# Patient Record
Sex: Female | Born: 1990 | Race: Black or African American | Hispanic: No | Marital: Married | Smoking: Never smoker
Health system: Southern US, Community
[De-identification: ages and names within clinical notes are randomized; demographics above are authoritative.]

## PROBLEM LIST (undated history)

## (undated) ENCOUNTER — Inpatient Hospital Stay (HOSPITAL_COMMUNITY): Payer: Self-pay

## (undated) DIAGNOSIS — Z789 Other specified health status: Secondary | ICD-10-CM

---
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
## 2008-02-14 ENCOUNTER — Ambulatory Visit: Payer: Self-pay | Admitting: Family Medicine

## 2008-03-26 ENCOUNTER — Emergency Department (HOSPITAL_COMMUNITY): Admission: EM | Admit: 2008-03-26 | Discharge: 2008-03-26 | Payer: Self-pay | Admitting: Emergency Medicine

## 2008-05-06 ENCOUNTER — Ambulatory Visit: Payer: Self-pay | Admitting: Family Medicine

## 2008-05-13 ENCOUNTER — Other Ambulatory Visit: Admission: RE | Admit: 2008-05-13 | Discharge: 2008-05-13 | Payer: Self-pay | Admitting: Family Medicine

## 2008-05-13 ENCOUNTER — Ambulatory Visit: Payer: Self-pay | Admitting: Family Medicine

## 2008-05-13 ENCOUNTER — Encounter (INDEPENDENT_AMBULATORY_CARE_PROVIDER_SITE_OTHER): Payer: Self-pay | Admitting: Family Medicine

## 2008-05-13 DIAGNOSIS — E669 Obesity, unspecified: Secondary | ICD-10-CM

## 2008-05-14 ENCOUNTER — Ambulatory Visit: Payer: Self-pay | Admitting: Family Medicine

## 2008-05-14 ENCOUNTER — Encounter (INDEPENDENT_AMBULATORY_CARE_PROVIDER_SITE_OTHER): Payer: Self-pay | Admitting: Family Medicine

## 2008-05-14 ENCOUNTER — Telehealth (INDEPENDENT_AMBULATORY_CARE_PROVIDER_SITE_OTHER): Payer: Self-pay | Admitting: *Deleted

## 2008-05-15 ENCOUNTER — Emergency Department (HOSPITAL_COMMUNITY): Admission: EM | Admit: 2008-05-15 | Discharge: 2008-05-15 | Payer: Self-pay | Admitting: Emergency Medicine

## 2008-05-19 ENCOUNTER — Encounter (INDEPENDENT_AMBULATORY_CARE_PROVIDER_SITE_OTHER): Payer: Self-pay | Admitting: Family Medicine

## 2008-05-27 IMAGING — CR DG ABD PORTABLE 1V
1 series · 1 of 1 positions shown · non-contrast
Comparison: None.

CLINICAL DATA: Post-op C-section.  Incorrect sponge count.  
 PORTABLE ABDOMEN - 1 VIEW 03/28/2007 AT 0000 HOURS:

[view not recorded]
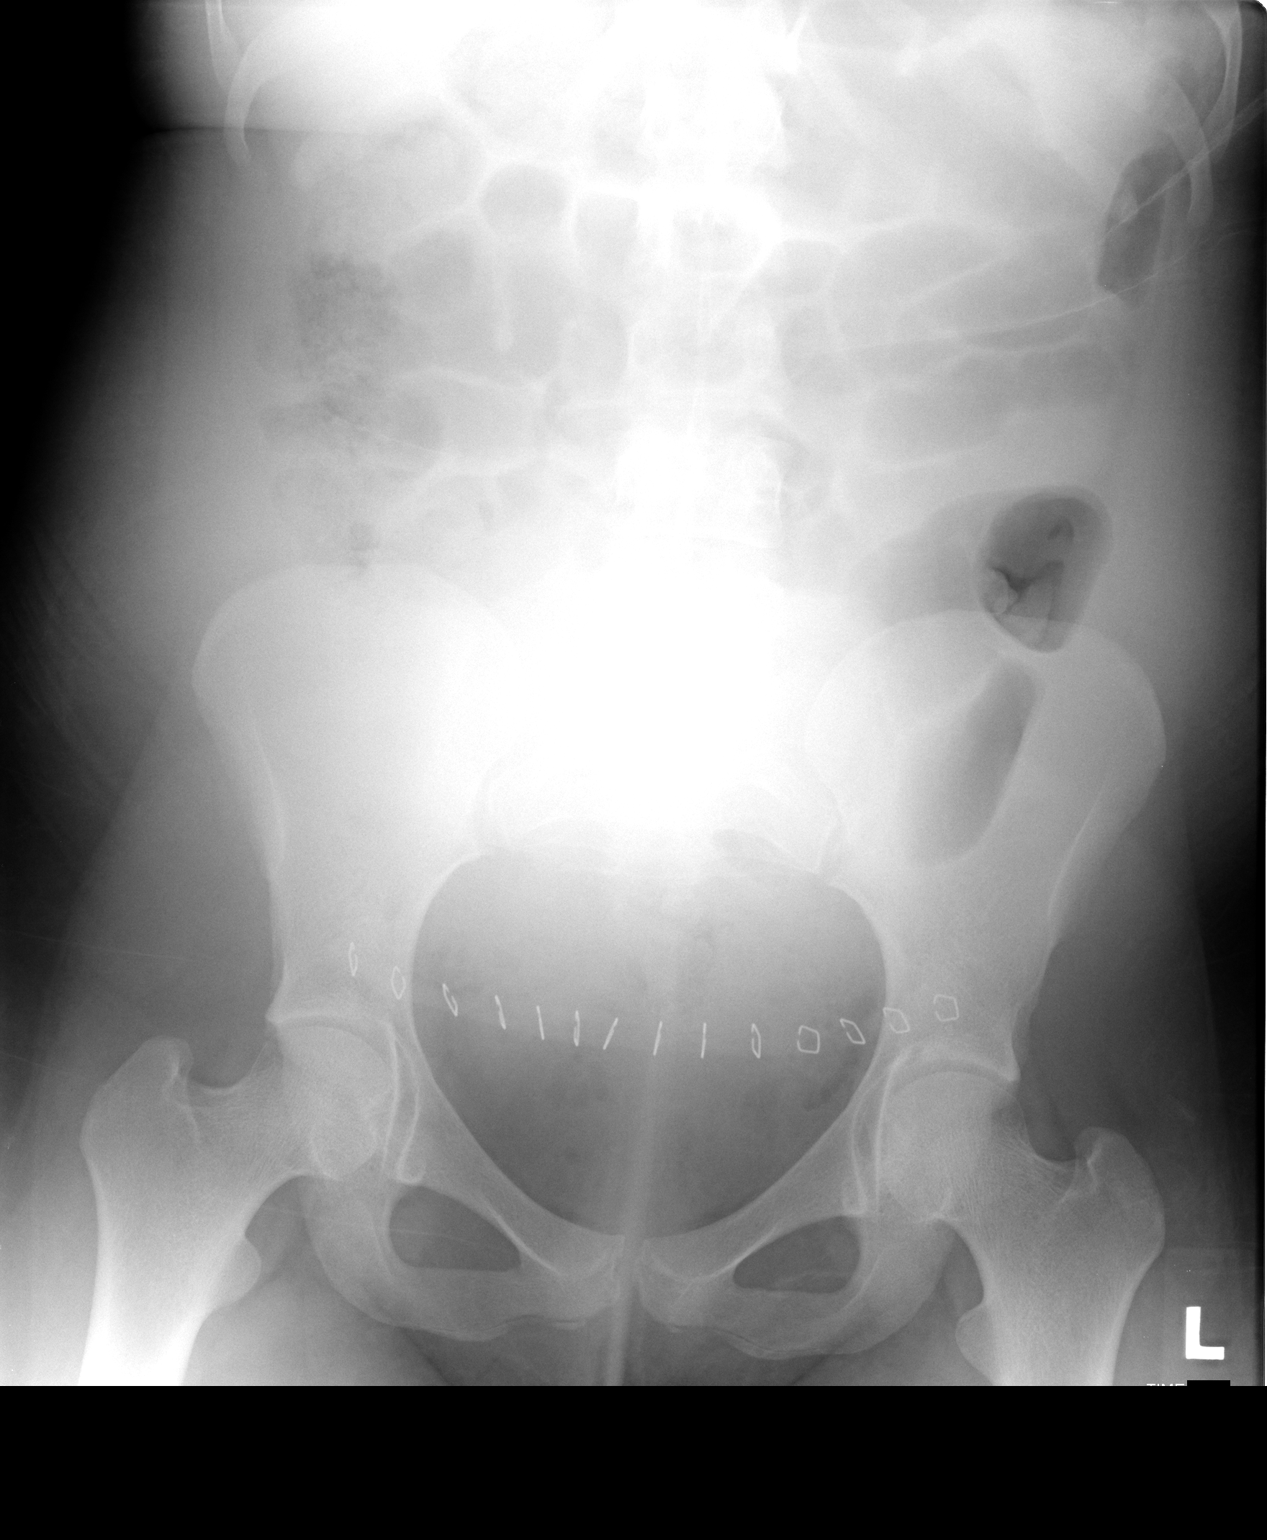

[1 of 1 positions shown; findings below may reference images not displayed]

FINDINGS: The study excludes the upper abdomen.  Midline skin staples are noted over the pelvis.  No retained sponge or instrument is demonstrated.  The bowel gas pattern appears nonobstructive.
IMPRESSION: No evidence of retained sponge in the field of view.

## 2008-07-08 ENCOUNTER — Emergency Department (HOSPITAL_COMMUNITY): Admission: EM | Admit: 2008-07-08 | Discharge: 2008-07-08 | Payer: Self-pay | Admitting: Emergency Medicine

## 2008-07-23 ENCOUNTER — Ambulatory Visit: Payer: Self-pay | Admitting: Family Medicine

## 2008-08-20 ENCOUNTER — Emergency Department (HOSPITAL_COMMUNITY): Admission: EM | Admit: 2008-08-20 | Discharge: 2008-08-20 | Payer: Self-pay | Admitting: Emergency Medicine

## 2008-09-17 ENCOUNTER — Ambulatory Visit: Payer: Self-pay | Admitting: Family Medicine

## 2008-09-18 ENCOUNTER — Ambulatory Visit: Payer: Self-pay | Admitting: Family Medicine

## 2008-09-19 ENCOUNTER — Encounter: Payer: Self-pay | Admitting: Family Medicine

## 2008-09-21 ENCOUNTER — Ambulatory Visit: Payer: Self-pay | Admitting: Family Medicine

## 2008-09-23 ENCOUNTER — Ambulatory Visit: Payer: Self-pay | Admitting: Family Medicine

## 2008-09-25 ENCOUNTER — Ambulatory Visit: Payer: Self-pay | Admitting: Family Medicine

## 2008-09-25 ENCOUNTER — Encounter: Payer: Self-pay | Admitting: *Deleted

## 2008-09-25 ENCOUNTER — Telehealth (INDEPENDENT_AMBULATORY_CARE_PROVIDER_SITE_OTHER): Payer: Self-pay | Admitting: Family Medicine

## 2008-10-09 ENCOUNTER — Ambulatory Visit: Payer: Self-pay | Admitting: Family Medicine

## 2008-10-28 ENCOUNTER — Emergency Department (HOSPITAL_COMMUNITY): Admission: EM | Admit: 2008-10-28 | Discharge: 2008-10-28 | Payer: Self-pay | Admitting: Emergency Medicine

## 2008-11-02 ENCOUNTER — Encounter (INDEPENDENT_AMBULATORY_CARE_PROVIDER_SITE_OTHER): Payer: Self-pay | Admitting: Family Medicine

## 2009-01-04 ENCOUNTER — Ambulatory Visit: Payer: Self-pay | Admitting: Family Medicine

## 2009-01-17 ENCOUNTER — Emergency Department (HOSPITAL_COMMUNITY): Admission: EM | Admit: 2009-01-17 | Discharge: 2009-01-18 | Payer: Self-pay | Admitting: Emergency Medicine

## 2009-04-02 ENCOUNTER — Ambulatory Visit: Payer: Self-pay | Admitting: Family Medicine

## 2009-05-25 ENCOUNTER — Ambulatory Visit: Payer: Self-pay | Admitting: Family Medicine

## 2009-05-25 ENCOUNTER — Encounter: Payer: Self-pay | Admitting: Family Medicine

## 2009-05-25 ENCOUNTER — Other Ambulatory Visit: Admission: RE | Admit: 2009-05-25 | Discharge: 2009-05-25 | Payer: Self-pay | Admitting: Family Medicine

## 2009-06-01 ENCOUNTER — Encounter: Payer: Self-pay | Admitting: Family Medicine

## 2009-06-01 ENCOUNTER — Ambulatory Visit: Payer: Self-pay | Admitting: Family Medicine

## 2009-06-01 LAB — CONVERTED CEMR LAB
CO2: 21 meq/L (ref 19–32)
Calcium: 9.4 mg/dL (ref 8.4–10.5)
Creatinine, Ser: 0.6 mg/dL (ref 0.40–1.20)

## 2009-06-30 ENCOUNTER — Ambulatory Visit: Payer: Self-pay | Admitting: Family Medicine

## 2009-07-23 ENCOUNTER — Ambulatory Visit: Payer: Self-pay | Admitting: Family Medicine

## 2009-07-26 ENCOUNTER — Encounter: Payer: Self-pay | Admitting: *Deleted

## 2009-08-03 ENCOUNTER — Encounter: Admission: RE | Admit: 2009-08-03 | Discharge: 2009-08-03 | Payer: Self-pay | Admitting: Family Medicine

## 2009-08-27 ENCOUNTER — Ambulatory Visit: Payer: Self-pay | Admitting: Family Medicine

## 2009-08-30 ENCOUNTER — Ambulatory Visit: Payer: Self-pay | Admitting: Family Medicine

## 2009-09-16 ENCOUNTER — Ambulatory Visit: Payer: Self-pay | Admitting: Family Medicine

## 2009-11-01 ENCOUNTER — Emergency Department (HOSPITAL_COMMUNITY): Admission: EM | Admit: 2009-11-01 | Discharge: 2009-11-01 | Payer: Self-pay | Admitting: Emergency Medicine

## 2009-12-15 ENCOUNTER — Ambulatory Visit: Payer: Self-pay | Admitting: Family Medicine

## 2009-12-17 ENCOUNTER — Emergency Department (HOSPITAL_COMMUNITY): Admission: EM | Admit: 2009-12-17 | Discharge: 2009-12-17 | Payer: Self-pay | Admitting: Emergency Medicine

## 2010-03-14 ENCOUNTER — Ambulatory Visit: Payer: Self-pay | Admitting: Family Medicine

## 2010-05-31 ENCOUNTER — Ambulatory Visit: Payer: Self-pay | Admitting: Family Medicine

## 2010-06-15 ENCOUNTER — Ambulatory Visit: Admission: RE | Admit: 2010-06-15 | Discharge: 2010-06-15 | Payer: Self-pay | Source: Home / Self Care

## 2010-06-15 ENCOUNTER — Encounter: Payer: Self-pay | Admitting: Family Medicine

## 2010-06-15 ENCOUNTER — Other Ambulatory Visit
Admission: RE | Admit: 2010-06-15 | Discharge: 2010-06-15 | Payer: Self-pay | Source: Home / Self Care | Admitting: Family Medicine

## 2010-06-21 ENCOUNTER — Encounter: Payer: Self-pay | Admitting: Family Medicine

## 2010-06-27 ENCOUNTER — Ambulatory Visit: Admission: RE | Admit: 2010-06-27 | Discharge: 2010-06-27 | Payer: Self-pay | Source: Home / Self Care

## 2010-07-03 ENCOUNTER — Encounter: Payer: Self-pay | Admitting: Family Medicine

## 2010-07-10 LAB — CONVERTED CEMR LAB
BUN: 14 mg/dL (ref 6–23)
Chlamydia, DNA Probe: POSITIVE — AB
Chloride: 104 meq/L (ref 96–112)
Creatinine, Ser: 0.56 mg/dL (ref 0.40–1.20)
GTT, 1 hr: <140 mg/dL

## 2010-07-12 NOTE — Assessment & Plan Note (Signed)
Summary: lump in breast,tcb   Vital Signs:  Patient profile:   20 year old female Height:      61 inches Weight:      196 pounds BMI:     37.17 Temp:     98.4 degrees F oral BP sitting:   116 / 70  (left arm) Cuff size:   large  Vitals Entered By: Tessie Fass CMA, (July 23, 2009 3:19 PM) CC: lump on right breast Is Patient Diabetic? No Pain Assessment Patient in pain? no        Primary Care Provider:  Jamie Brookes MD  CC:  lump on right breast.  History of Present Illness: CC: lump right breast  Yesterday felt lump right breast medial to nipple.  Tender when pusing on it, hadn't noticed before.  No recent f/c/n/v/breast pain o/w, discharge, weight changes, or recent viral ilnesses.  No redness, warmth at area.  On depo shot - started 5 years ago.  Recently restarted 2 yrs ago.  fm hx of breast Ca in aunt, who passed from it, unsure age.  concerned about what happened to aunt.  Habits & Providers  Alcohol-Tobacco-Diet     Tobacco Status: never  Current Medications (verified): 1)  Depo-Provera 150 Mg/ml Susp (Medroxyprogesterone Acetate) .Marland Kitchen.. 1 Inj Q3 Months 2)  Calcium Citrate 200 Mg Tabs (Calcium Citrate) .... Take 3 Pills Daily For Your Bone Health  Allergies: 1)  ! Bactrim  Past History:  Past medical, surgical, family and social histories (including risk factors) reviewed for relevance to current acute and chronic problems.  Past Medical History: Reviewed history from 05/13/2008 and no changes required. OBESITY (ICD-278.00)  Past Surgical History: Reviewed history from 05/13/2008 and no changes required. TAB - 05/12/2004 c/s 10/08  Family History: HTN-dad  DM-sister, dad, GM Breast cancer - aunt who passed from it, unsure age  Cancer-uncle colon cancer  Social History: Reviewed history from 05/25/2009 and no changes required. lives with son (2) Early childhood education at [facility] no exercise; no sports  sexually active - uses condoms  occasionally no tobacco/ETOH/drugs  Physical Exam  General:  Well-developed,well-nourished,in no acute distress; alert,appropriate and cooperative throughout examination Breasts:  Left breast: No mass, nodules, thickening, tenderness, bulging, retraction, inflamation, nipple discharge or skin changes noted.   Right breast: fixed lump medial and slightly superior to aureola.  No axillary LAD.  No erythema, edema, not tender to palpation.   Impression & Recommendations:  Problem # 1:  BREAST MASS, RIGHT (ICD-611.72) likely benign, however given somewhat fixed and fm hx will refer to breast for diagnostic mammogram.  Advised we will call her with appt, to call us if not heard within 1 wk.    Orders: [facility]- Est Level  3 (99213) Mammogram (Diagnostic) (Mammo)  Complete Medication List: 1)  Depo-provera 150 Mg/ml Susp (Medroxyprogesterone acetate) .Marland Kitchen.. 1 inj q3 months 2)  Calcium Citrate 200 Mg Tabs (Calcium citrate) .... Take 3 pills daily for your bone health  Patient Instructions: 1)  This is likely something benign called fibrocystic breast change. 2)  However, we will still set you up for a mammogram and ultrasound to ensure that everything looks ok.  3)  Please call us if you haven't had your ultrasound in 2 wks.

## 2010-07-12 NOTE — Assessment & Plan Note (Signed)
Summary: read ppd/kh  Nurse Visit   Allergies: 1)  ! Bactrim  PPD Results    Date of reading: 08/30/2009    Results: 0 mm    Interpretation: negative  Orders Added: 1)  No Charge Patient Arrived (NCPA0) [NCPA0]

## 2010-07-12 NOTE — Assessment & Plan Note (Signed)
Summary: DEPO/[facility]  Nurse Visit   Allergies: 1)  ! Bactrim  Medication Administration  Injection # 1:    Medication: Depo-Provera 150mg     Diagnosis: CONTRACEPTIVE MANAGEMENT (ICD-V25.09)    Route: IM    Site: LUOQ gluteus    Exp Date: 10/11/2010    Lot #: [id number]    Mfr: Francisca December    Comments: Next Depo Due: June 23 - July 7    Patient tolerated injection without complications    Given by: Garen Grams LPN (September 16, 452 2:04 PM)  Orders Added: 1)  Depo-Provera 150mg  [J1055] 2)  Est Level 1- [facility] [09811]   Medication Administration  Injection # 1:    Medication: Depo-Provera 150mg     Diagnosis: CONTRACEPTIVE MANAGEMENT (ICD-V25.09)    Route: IM    Site: LUOQ gluteus    Exp Date: 10/11/2010    Lot #: [id number]    Mfr: Francisca December    Comments: Next Depo Due: June 23 - July 7    Patient tolerated injection without complications    Given by: Garen Grams LPN (September 16, 9560 2:04 PM)  Orders Added: 1)  Depo-Provera 150mg  [J1055] 2)  Est Level 1- [facility] [13086]

## 2010-07-12 NOTE — Assessment & Plan Note (Signed)
Summary: depo,df  Nurse Visit   Allergies: 1)  ! Bactrim  Medication Administration  Injection # 1:    Medication: Depo-Provera 150mg     Diagnosis: CONTRACEPTIVE MANAGEMENT (ICD-V25.09)    Route: IM    Site: R deltoid    Exp Date: 09/2011    Lot #: [id number]    Mfr: [email or web address]    Comments: next Depo due April 6 thru September 29, 2009    Patient tolerated injection without complications    Given by: Theresia Lo RN (June 30, 2009 2:44 PM)  Orders Added: 1)  Depo-Provera 150mg  [J1055] 2)  Admin of Injection (IM/SQ) [04540]   Medication Administration  Injection # 1:    Medication: Depo-Provera 150mg     Diagnosis: CONTRACEPTIVE MANAGEMENT (ICD-V25.09)    Route: IM    Site: R deltoid    Exp Date: 09/2011    Lot #: [id number]    Mfr: [email or web address]    Comments: next Depo due April 6 thru September 29, 2009    Patient tolerated injection without complications    Given by: Theresia Lo RN (June 30, 2009 2:44 PM)  Orders Added: 1)  Depo-Provera 150mg  [J1055] 2)  Admin of Injection (IM/SQ) [47829]

## 2010-07-12 NOTE — Assessment & Plan Note (Signed)
Summary: depo,tcb  Nurse Visit   Allergies: 1)  ! Bactrim  Medication Administration  Injection # 1:    Medication: Depo-Provera 150mg     Diagnosis: CONTRACEPTIVE MANAGEMENT (ICD-V25.09)    Route: IM    Site: L deltoid    Exp Date: 07/2012    Lot #: [id number]    Mfr: [email or web address]    Comments: next depo due Sept 21 thru Mar 15, 2010    Patient tolerated injection without complications    Given by: Theresia Lo RN (December 15, 2009 2:58 PM)  Orders Added: 1)  Depo-Provera 150mg  [J1055] 2)  Admin of Injection (IM/SQ) [[id number]]   Medication Administration  Injection # 1:    Medication: Depo-Provera 150mg     Diagnosis: CONTRACEPTIVE MANAGEMENT (ICD-V25.09)    Route: IM    Site: L deltoid    Exp Date: 07/2012    Lot #: [id number]    Mfr: [email or web address]    Comments: next depo due Sept 21 thru Mar 15, 2010    Patient tolerated injection without complications    Given by: Theresia Lo RN (December 15, 2009 2:58 PM)  Orders Added: 1)  Depo-Provera 150mg  [J1055] 2)  Admin of Injection (IM/SQ) [75643]

## 2010-07-12 NOTE — Assessment & Plan Note (Signed)
Summary: depo,tcb  Nurse Visit   Allergies: 1)  ! Bactrim  Medication Administration  Injection # 1:    Medication: Depo-Provera 150mg     Diagnosis: CONTRACEPTIVE MANAGEMENT (ICD-V25.09)    Route: IM    Site: L deltoid    Exp Date: 08/2012    Lot #: [id number]    Mfr: [email or web address]    Comments: Next depo due Dec 19 thru   Jun 13, 2010    Patient tolerated injection without complications    Given by: Theresia Lo RN (March 14, 2010 3:28 PM)  Orders Added: 1)  Depo-Provera 150mg  [J1055] 2)  Admin of Injection (IM/SQ) [04540]   Medication Administration  Injection # 1:    Medication: Depo-Provera 150mg     Diagnosis: CONTRACEPTIVE MANAGEMENT (ICD-V25.09)    Route: IM    Site: L deltoid    Exp Date: 08/2012    Lot #: [id number]    Mfr: [email or web address]    Comments: Next depo due Dec 19 thru   Jun 13, 2010    Patient tolerated injection without complications    Given by: Theresia Lo RN (March 14, 2010 3:28 PM)  Orders Added: 1)  Depo-Provera 150mg  [J1055] 2)  Admin of Injection (IM/SQ) [47829]

## 2010-07-12 NOTE — Miscellaneous (Signed)
Summary: re: breast US/TS  Clinical Lists Changes  Orders: Added new Test order of Ultrasound (Ultrasound) - Signed

## 2010-07-12 NOTE — Assessment & Plan Note (Signed)
Summary: tb test,tcb  Nurse Visit   Allergies: 1)  ! Bactrim  Immunizations Administered:  PPD Skin Test:    Vaccine Type: PPD    Site: right forearm    Mfr: Sanofi Pasteur    Dose: 0.1 ml    Route: ID    Given by: Theresia Lo RN    Exp. Date: 11/07/2011    Lot #: [id number]  Orders Added: 1)  TB Skin Test [86580] 2)  Admin 1st Vaccine [id number]

## 2010-07-14 NOTE — Assessment & Plan Note (Signed)
Summary: physical/pap/bmc   Vital Signs:  Patient profile:   20 year old female Height:      61 inches Weight:      202 pounds BMI:     38.31 Temp:     98.5 degrees F oral Pulse rate:   77 / minute Pulse rhythm:   regular BP sitting:   110 / 70  (left arm) Cuff size:   large  Vitals Entered By: Loralee Pacas CMA (June 15, 2010 9:06 AM) CC: cpp Is Patient Diabetic? No Pain Assessment Patient in pain? no        Primary Care Provider:  Jamie Brookes MD  CC:  cpp.  History of Present Illness: Pt comes in for a full physcial today. She needs this for her work. No concerns other than her weight.   Obesity: Pt is hoping to exercise more this year. Discussed nutrition appointment with Dr. Gerilyn Pilgrim.   Contraception: Discussed Depo risks (osteoporosis) and what her wishes are for having another child. She says she knows the risk and doesn't want to change her birth control method at this time.   Habits & Providers  Alcohol-Tobacco-Diet     Tobacco Status: never     Cigarette Packs/Day: n/a  Exercise-Depression-Behavior     Have you felt down or hopeless? no     Have you felt little pleasure in things? no     Depression Counseling: not indicated; screening negative for depression     Seat Belt Use: always  Current Medications (verified): 1)  Depo-Provera 150 Mg/ml Susp (Medroxyprogesterone Acetate) .Marland Kitchen.. 1 Inj Q3 Months 2)  Calcium Citrate 200 Mg Tabs (Calcium Citrate) .... Take 3 Pills Daily For Your Bone Health  Allergies (verified): 1)  ! Bactrim  Social History: Risk analyst Use:  always  Physical Exam  General:  Well-developed,well-nourished,in no acute distress; alert,appropriate and cooperative throughout examination Head:  Normocephalic and atraumatic without obvious abnormalities. No apparent alopecia or balding. Eyes:  No corneal or conjunctival inflammation noted. EOMI. Perrla. Funduscopic exam benign, without hemorrhages, exudates or papilledema. Vision  grossly normal. Ears:  External ear exam shows no significant lesions or deformities.  Otoscopic examination reveals clear canals, tympanic membranes are intact bilaterally without bulging, retraction, inflammation or discharge. Hearing is grossly normal bilaterally. Nose:  External nasal examination shows no deformity or inflammation. Nasal mucosa are pink and moist without lesions or exudates. Mouth:  Oral mucosa and oropharynx without lesions or exudates.  Teeth in good repair. Neck:  No deformities, masses, or tenderness noted. Breasts:  No mass, nodules, thickening, tenderness, bulging, retraction, inflamation, nipple discharge or skin changes noted.   Lungs:  Normal respiratory effort, chest expands symmetrically. Lungs are clear to auscultation, no crackles or wheezes. Heart:  Normal rate and regular rhythm. S1 and S2 normal without gallop, murmur, click, rub or other extra sounds. Abdomen:  abdomen soft and non-tender without masses, organomegaly or hernias noted. Genitalia:  Normal introitus for age, no external lesions, no vaginal discharge, mucosa pink and moist, no vaginal or cervical lesions, no vaginal atrophy, no friaility or hemorrhage, normal uterus size and position, no adnexal masses or tenderness Msk:  No deformity or scoliosis noted of thoracic or lumbar spine.   Extremities:  No clubbing, cyanosis, edema, or deformity noted with normal full range of motion of all joints.   Skin:  Intact without suspicious lesions or rashes Axillary Nodes:  No palpable lymphadenopathy Psych:  Cognition and judgment appear intact. Alert and cooperative with normal attention  span and concentration. No apparent delusions, illusions, hallucinations   Impression & Recommendations:  Problem # 1:  HEALTH MAINTENANCE EXAM (ICD-V70.0) Assessment Unchanged Pt is doing well, wants to cont Depo, will consider talking to Dr. Gerilyn Pilgrim about nutrition in the future. I will fill out forms and get them back to  her.   Orders: [facility] - Est  18-39 yrs (57846)  Complete Medication List: 1)  Depo-provera 150 Mg/ml Susp (Medroxyprogesterone acetate) .Marland Kitchen.. 1 inj q3 months 2)  Calcium Citrate 200 Mg Tabs (Calcium citrate) .... Take 3 pills daily for your bone health  Other Orders: Pap Smear-[facility] ([id number])  Patient Instructions: 1)  Dr. Gerilyn Pilgrim, our nitritionist, is available for weight loss consults.    Orders Added: 1)  Pap Smear-[facility] [[id number]] 2)  [facility] - Est  18-39 yrs [[id number]]

## 2010-07-14 NOTE — Miscellaneous (Signed)
Summary: Medical Report  pt dropped off form to be completed, gave to Terese Door for any clinical completion. Alice Frye  June 15, 2010 9:57 AM  Medical Report form placed in Dr. Lorelee Market box for completion.  Terese Door  June 15, 2010 10:09 AM   Form completed by Dr Clotilde Dieter and Alethia Berthold notified form was ready to be picked up at front desk.  Terese Door  June 15, 2010 2:01 PM

## 2010-07-14 NOTE — Assessment & Plan Note (Signed)
Summary: np/nutr appt/eo   Vital Signs:  Patient profile:   20 year old female Height:      61 inches  Vitals Entered By: Wyona Almas PHD (June 27, 2010 11:20 AM)  Allergies: 1)  ! Bactrim   Complete Medication List: 1)  Depo-provera 150 Mg/ml Susp (Medroxyprogesterone acetate) .Marland Kitchen.. 1 inj q3 months 2)  Calcium Citrate 200 Mg Tabs (Calcium citrate) .... Take 3 pills daily for your bone health  Other Orders: No Charge Patient Arrived (NCPA0) (NCPA0)   Orders Added: 1)  No Charge Patient Arrived (NCPA0) [NCPA0]

## 2010-07-14 NOTE — Assessment & Plan Note (Signed)
Summary: depo/strother/bmc  Nurse Visit   Allergies: 1)  ! Bactrim  Medication Administration  Injection # 1:    Medication: Depo-Provera 150mg     Diagnosis: CONTRACEPTIVE MANAGEMENT (ICD-V25.09)    Route: IM    Site: L deltoid    Exp Date: 10/2012    Lot #: [id number]    Mfr: [email or web address]    Comments:  next depo due March 7 thru September 03, 2010    Patient tolerated injection without complications    Given by: Theresia Lo RN (May 31, 2010 10:18 AM)  Orders Added: 1)  Depo-Provera 150mg  [J1055] 2)  Admin of Injection (IM/SQ) [21308]     Medication Administration  Injection # 1:    Medication: Depo-Provera 150mg     Diagnosis: CONTRACEPTIVE MANAGEMENT (ICD-V25.09)    Route: IM    Site: L deltoid    Exp Date: 10/2012    Lot #: [id number]    Mfr: greenstone    Comments:  next depo due March 7 thru September 03, 2010    Patient tolerated injection without complications    Given by: Theresia Lo RN (May 31, 2010 10:18 AM)  Orders Added: 1)  Depo-Provera 150mg  [J1055] 2)  Admin of Injection (IM/SQ) [96295]

## 2010-07-14 NOTE — Letter (Signed)
Summary: Generic Letter  [facility]  [address]   [place], [place] [address]   Phone: [phone]  Fax: [phone]    06/21/2010  Alice Frye [address] [place], [place]  [address]  Dear Ms. Alice Frye,   Your pap smear was normal. You will need a repeat pap in 1 year. When you have had 3 normal pap smears in a row you can space them out to every other year. Please make an appointment in 1 year for a pap smear.     Sincerely,   Jamie Brookes MD  Appended Document: Generic Letter mailed

## 2010-08-28 LAB — URINALYSIS, ROUTINE W REFLEX MICROSCOPIC
Bilirubin Urine: NEGATIVE
Glucose, UA: NEGATIVE mg/dL
Hgb urine dipstick: NEGATIVE
Ketones, ur: NEGATIVE mg/dL
Nitrite: NEGATIVE
Protein, ur: NEGATIVE mg/dL
Specific Gravity, Urine: 1.033 — ABNORMAL HIGH (ref 1.005–1.030)
Urobilinogen, UA: 0.2 mg/dL (ref 0.0–1.0)
pH: 5.5 (ref 5.0–8.0)

## 2010-08-28 LAB — PREGNANCY, URINE: Preg Test, Ur: NEGATIVE

## 2010-08-29 LAB — RAPID STREP SCREEN (MED CTR MEBANE ONLY): Streptococcus, Group A Screen (Direct): POSITIVE — AB

## 2010-08-31 ENCOUNTER — Ambulatory Visit (INDEPENDENT_AMBULATORY_CARE_PROVIDER_SITE_OTHER): Payer: Medicaid Other | Admitting: *Deleted

## 2010-08-31 DIAGNOSIS — Z309 Encounter for contraceptive management, unspecified: Secondary | ICD-10-CM

## 2010-08-31 MED ORDER — MEDROXYPROGESTERONE ACETATE 150 MG/ML IM SUSP
150.0000 mg | Freq: Once | INTRAMUSCULAR | Status: AC
  Administered 2010-08-31: 150 mg via INTRAMUSCULAR

## 2010-09-20 LAB — DIFFERENTIAL
Basophils Absolute: 0 10*3/uL (ref 0.0–0.1)
Basophils Relative: 0 % (ref 0–1)
Eosinophils Relative: 2 % (ref 0–5)
Monocytes Absolute: 0.2 10*3/uL (ref 0.1–1.0)
Neutro Abs: 2.2 10*3/uL (ref 1.7–7.7)

## 2010-09-20 LAB — COMPREHENSIVE METABOLIC PANEL
Albumin: 3.4 g/dL — ABNORMAL LOW (ref 3.5–5.2)
Alkaline Phosphatase: 112 U/L (ref 39–117)
BUN: 7 mg/dL (ref 6–23)
CO2: 22 mEq/L (ref 19–32)
Chloride: 106 mEq/L (ref 96–112)
GFR calc non Af Amer: >60 mL/min (ref >60)
Potassium: 3.5 mEq/L (ref 3.5–5.1)
Total Bilirubin: 0.5 mg/dL (ref 0.3–1.2)

## 2010-09-20 LAB — CBC
HCT: 36.3 % (ref 36.0–46.0)
Hemoglobin: 11.9 g/dL — ABNORMAL LOW (ref 12.0–15.0)
Platelets: 243 10*3/uL (ref 150–400)
RBC: 4.68 MIL/uL (ref 3.87–5.11)
WBC: 4.9 10*3/uL (ref 4.0–10.5)

## 2010-09-20 LAB — URINALYSIS, ROUTINE W REFLEX MICROSCOPIC
Glucose, UA: NEGATIVE mg/dL
Hgb urine dipstick: NEGATIVE
Protein, ur: NEGATIVE mg/dL
Specific Gravity, Urine: 1.025 (ref 1.005–1.030)
pH: 7 (ref 5.0–8.0)

## 2010-09-22 LAB — URINALYSIS, ROUTINE W REFLEX MICROSCOPIC
Glucose, UA: NEGATIVE mg/dL
Ketones, ur: NEGATIVE mg/dL
pH: 6.5 (ref 5.0–8.0)

## 2010-09-22 LAB — COMPREHENSIVE METABOLIC PANEL
ALT: 16 U/L (ref 0–35)
AST: 22 U/L (ref 0–37)
Albumin: 3.7 g/dL (ref 3.5–5.2)
Calcium: 9.4 mg/dL (ref 8.4–10.5)
GFR calc Af Amer: >60 mL/min (ref >60)
Sodium: 138 mEq/L (ref 135–145)
Total Protein: 7.4 g/dL (ref 6.0–8.3)

## 2010-09-22 LAB — DIFFERENTIAL
Eosinophils Absolute: 0.1 10*3/uL (ref 0.0–0.7)
Eosinophils Relative: 2 % (ref 0–5)
Lymphs Abs: 1.2 10*3/uL (ref 0.7–4.0)
Monocytes Relative: 6 % (ref 3–12)

## 2010-09-22 LAB — CBC
MCHC: 33.1 g/dL (ref 30.0–36.0)
RBC: 4.49 MIL/uL (ref 3.87–5.11)
RDW: 15.8 % — ABNORMAL HIGH (ref 11.5–15.5)

## 2010-09-26 LAB — COMPREHENSIVE METABOLIC PANEL
BUN: 10 mg/dL (ref 6–23)
Calcium: 9.1 mg/dL (ref 8.4–10.5)
Creatinine, Ser: 0.59 mg/dL (ref 0.4–1.2)
Glucose, Bld: 96 mg/dL (ref 70–99)
Sodium: 137 mEq/L (ref 135–145)
Total Protein: 7.4 g/dL (ref 6.0–8.3)

## 2010-09-26 LAB — DIFFERENTIAL
Lymphs Abs: 1.1 10*3/uL (ref 0.7–4.0)
Monocytes Relative: 8 % (ref 3–12)
Neutro Abs: 3.6 10*3/uL (ref 1.7–7.7)
Neutrophils Relative %: 69 % (ref 43–77)

## 2010-09-26 LAB — URINE CULTURE

## 2010-09-26 LAB — URINALYSIS, ROUTINE W REFLEX MICROSCOPIC
Hgb urine dipstick: NEGATIVE
Specific Gravity, Urine: 1.03 (ref 1.005–1.030)
Urobilinogen, UA: 1 mg/dL (ref 0.0–1.0)

## 2010-09-26 LAB — CBC
HCT: 34 % — ABNORMAL LOW (ref 36.0–46.0)
Hemoglobin: 11 g/dL — ABNORMAL LOW (ref 12.0–15.0)
MCHC: 32.3 g/dL (ref 30.0–36.0)
MCV: 77.2 fL — ABNORMAL LOW (ref 78.0–100.0)
RDW: 15.9 % — ABNORMAL HIGH (ref 11.5–15.5)

## 2010-09-26 LAB — LIPASE, BLOOD: Lipase: 18 U/L (ref 11–59)

## 2010-10-12 ENCOUNTER — Ambulatory Visit (INDEPENDENT_AMBULATORY_CARE_PROVIDER_SITE_OTHER): Payer: Medicaid Other | Admitting: *Deleted

## 2010-10-12 ENCOUNTER — Emergency Department (HOSPITAL_COMMUNITY)
Admission: EM | Admit: 2010-10-12 | Discharge: 2010-10-12 | Disposition: A | Discharge status: Home / Self Care | Payer: Medicaid Other | Attending: Emergency Medicine | Admitting: Emergency Medicine

## 2010-10-12 DIAGNOSIS — K047 Periapical abscess without sinus: Secondary | ICD-10-CM | POA: Insufficient documentation

## 2010-10-12 DIAGNOSIS — R221 Localized swelling, mass and lump, neck: Secondary | ICD-10-CM | POA: Insufficient documentation

## 2010-10-12 DIAGNOSIS — R220 Localized swelling, mass and lump, head: Secondary | ICD-10-CM | POA: Insufficient documentation

## 2010-10-12 DIAGNOSIS — Z111 Encounter for screening for respiratory tuberculosis: Secondary | ICD-10-CM

## 2010-10-14 ENCOUNTER — Ambulatory Visit (INDEPENDENT_AMBULATORY_CARE_PROVIDER_SITE_OTHER): Payer: Medicaid Other | Admitting: *Deleted

## 2010-10-14 DIAGNOSIS — IMO0001 Reserved for inherently not codable concepts without codable children: Secondary | ICD-10-CM

## 2010-10-14 DIAGNOSIS — Z111 Encounter for screening for respiratory tuberculosis: Secondary | ICD-10-CM

## 2010-10-14 LAB — TB SKIN TEST
Induration: 0
TB Skin Test: NEGATIVE mm

## 2010-10-14 NOTE — Progress Notes (Signed)
PPD negative, 0mm.

## 2010-10-20 ENCOUNTER — Emergency Department (HOSPITAL_COMMUNITY)
Admission: EM | Admit: 2010-10-20 | Discharge: 2010-10-20 | Disposition: A | Discharge status: Home / Self Care | Payer: Medicaid Other | Attending: Emergency Medicine | Admitting: Emergency Medicine

## 2010-10-20 DIAGNOSIS — K089 Disorder of teeth and supporting structures, unspecified: Secondary | ICD-10-CM | POA: Insufficient documentation

## 2010-10-25 NOTE — Discharge Summary (Signed)
Alice Frye, Alice Frye NO.:  [id number]   MEDICAL RECORD NO.:  [id number]          PATIENT TYPE:  INP   LOCATION:  9129                          FACILITY:  [facility]   PHYSICIAN:  Tilda Burrow, M.D. DATE OF BIRTH:  05/28/1991   DATE OF ADMISSION:  03/28/2007  DATE OF DISCHARGE:  03/31/2007                               DISCHARGE SUMMARY   REASON FOR ADMISSION:  Onset of labor.   PROCEDURES PRENATAL:  None.   PROCEDURES INTRAPARTUM:  Cesarean low cervical transverse.   PROCEDURES POSTPARTUM:  RhoGAM given.   COMPLICATIONS OPERATIVE AND POSTPARTUM:  None.   BRIEF HOSPITAL COURSE:  The patient is an 20 year old G1, P 1-0-1-1, who  presented after spontaneous rupture of membranes clear fluid at 38-3/7  weeks.  The patient presented to MAU in no acute distress and was placed  on the floor and initially monitored for contractions and progression.  The patient was placed briefly on Pitocin, with good response, and then  taken off Pitocin secondary to uterine contractions frequency.  The  patient was managed expectantly for approximately 12 hours, with  adequate proceed progression to 8 cm cervical dilation, whereafter the  patient decelerated in her labor progress.  Pitocin was restarted and  uptitrated to the limits of the protocol, limited primarily by uterine  contraction frequency.  IUPC was placed, and the patient's MVUs peaked  at 170-180.  The patient was without adequate labor progress for 2  hours, and at that time fetal heart tone was noted to be increasing from  the prior baseline of 140 to 150-160.  This was in the context of a GBS  positive mother who was appropriately placed on penicillin at  presentation to maternity admissions.  The patient's temperature  rectally was noted to be 101.6, taken at the time of the fetal  tachycardia and following 2 hours of failure to progress.  At that time,  surgical intervention was discussed with the mother, and she  was  amenable.  The patient was taken to the operating room, where a low  cervical transverse cesarean section was performed.  Infant was  delivered with Apgars of 8 and 9.  The patient was given Pitocin and  Methergine secondary to some uterine atony.  The patient was placed on  intravenous ampicillin, clindamycin, and gentamicin postoperatively,  with a routine postoperative course.  The patient was discharged home on  postoperative day #3 stable, afebrile, with minimal lochia and adequate  pain control on oral anti-inflammatories.  The patient's discharge  hemoglobin was 9.4, with an initial presentation of 10.1.  Blood type B  negative, the patient was given RhoGAM.  She was given Depo-Provera for  contraception, and was rubella immune, RPR negative.  The patient was  scheduled for followup with Dr. Mannie Stabile at the [facility] 6 weeks after discharge.  [facility] was contacted for staple  removal 2 days postdischarge.  The patient was sent home with 800 mg  ibuprofen t.i.d. for 7 days, and Colace 100 mg b.i.d., with unrestricted  activity, routine diet, and discharge  to home.      Towana Badger, M.D.      Tilda Burrow, M.D.  Electronically Signed    JP/MEDQ  D:  03/31/2007  T:  04/01/2007  Job:  [id number]

## 2010-10-25 NOTE — Op Note (Signed)
Alice Frye, MICKELSON NO.:  [id number]   MEDICAL RECORD NO.:  [id number]          PATIENT TYPE:  INP   LOCATION:  9129                          FACILITY:  [facility]   PHYSICIAN:  Lesly Dukes, M.D. DATE OF BIRTH:  09-12-1990   DATE OF PROCEDURE:  03/28/2007  DATE OF DISCHARGE:                               OPERATIVE REPORT   PREOPERATIVE DIAGNOSES:  An 20 year old, gravida 2, para 0-0-1-0, at 17  and 2 with failure to progress, chorioamnionitis, and occiput-posterior  presentation.   POSTOPERATIVE DIAGNOSES:  An 20 year old, gravida 2, para 0-0-1-0, at 51  and 2 with failure to progress, chorioamnionitis, and occiput-posterior  presentation.   PROCEDURE:  Primary low transverse cesarean section.   SURGEON:  Lesly Dukes, MD.   ASSISTANT:  Towana Badger, MD.   ANESTHESIA:  Epidural.   SPECIMENS:  Placenta to Pathology.   ESTIMATED BLOOD LOSS:  800.   COMPLICATIONS:  None.   FINDINGS:  A viable female infant, Apgar's 7 at one minute and 8 at five  minutes, clear fluid, vertex LOP presentation, grossly normal uterus,  ovaries, and fallopian tubes.   PROCEDURE:  After informed consent was obtained, the patient was taken  to the operating room where epidural anesthesia was found to be  adequate.  The patient was placed in the dorsal supine position with a  leftward tilt, and prepared and draped in a normal sterile fashion.  A  Pfannenstiel skin incision was made with a scalpel and carried down to  the fascia, the fascia was incised in the midline, and the incision was  extended bilaterally with the Mayo scissors.  The superior and inferior  aspects of the fascial incision were grasped with Kocher clamps, tented  up, and dissected off sharply and bluntly from the underlying layers of  rectus muscles.  The rectus muscles were separated in the midline.  The  peritoneum was entered bluntly and extended with good visualization of  the bladder, the bladder blade  was inserted, the bladder flap was  created, and the bladder blades were reinserted.  The uterine incision  was made in a transverse fashion in the lower uterine segment and  extended bilaterally bluntly.  The head delivered atraumatically, the  nose and mouth were suctioned. The baby's body delivered easily, the  cord was clamped and cut, and the baby was handed off to the awaiting  pediatrician.  Cord blood was sent for typed and screened , and arterial  cord blood gases also sent.  The placenta delivered manually, it had a  three-vessel cord.  The uterus was cleared of all clots and debris.  The  infant was found to be atonic, and Methergine and Pitocin were given.  The uterine incision was closed with #0 Vicryl in a running lock  fashion.  A second suture of #0 Vicryl was used to reinforce the  incision and aid in hemostasis.  The gutters were cleared of all clots  and debris, and the abdomen was copiously irrigated, and the uterus was  noted to be hemostatic off tension.  The peritoneum and the  rectus  muscles were noted to be hemostatic, the fascia was closed with #0  Vicryl in a running fashion, good hemostasis was noted, the subcutaneous  tissue was copiously irrigated and found to be hemostatic.  The skin was  closed with staples and a pressure dressing was placed on the abdomen.  There was incorrect sponge count at the end of the procedure.  Preprocedure there were 20 sponges and post procedure there were 21  sponges.  An x-ray was ordered for the the patient to recovery.  The  rest of the counts were correct.  The patient went to the recovery room  in stable condition.      Lesly Dukes, M.D.  Electronically Signed     K[facility]/MEDQ  D:  03/28/2007  T:  03/29/2007  Job:  [id number]

## 2010-10-27 ENCOUNTER — Telehealth: Payer: Self-pay | Admitting: Family Medicine

## 2010-10-27 NOTE — Telephone Encounter (Signed)
Patient left form to be filled out for work.  Please call her when completed.

## 2010-10-28 NOTE — Telephone Encounter (Signed)
Clinical information completed and form placed in Dr. Lorelee Market box.

## 2010-10-31 NOTE — Telephone Encounter (Signed)
Left message for patient that the form was ready for pick up.

## 2010-11-23 ENCOUNTER — Ambulatory Visit (INDEPENDENT_AMBULATORY_CARE_PROVIDER_SITE_OTHER): Payer: Medicaid Other | Admitting: *Deleted

## 2010-11-23 DIAGNOSIS — Z3049 Encounter for surveillance of other contraceptives: Secondary | ICD-10-CM

## 2010-11-23 DIAGNOSIS — Z309 Encounter for contraceptive management, unspecified: Secondary | ICD-10-CM

## 2010-11-23 MED ORDER — MEDROXYPROGESTERONE ACETATE 150 MG/ML IM SUSP
150.0000 mg | Freq: Once | INTRAMUSCULAR | Status: AC
  Administered 2010-11-23: 150 mg via INTRAMUSCULAR

## 2011-03-08 LAB — WET PREP, GENITAL: Yeast Wet Prep HPF POC: NONE SEEN

## 2011-03-08 LAB — GC/CHLAMYDIA PROBE AMP, GENITAL: Chlamydia, DNA Probe: NEGATIVE

## 2011-03-09 LAB — POCT URINALYSIS DIP (DEVICE)
Bilirubin Urine: NEGATIVE
Glucose, UA: NEGATIVE
Hgb urine dipstick: NEGATIVE
Nitrite: NEGATIVE
Operator id: 29721
Specific Gravity, Urine: 1.02

## 2011-03-09 LAB — POCT PREGNANCY, URINE: Preg Test, Ur: NEGATIVE

## 2011-03-09 LAB — URINE CULTURE

## 2011-03-16 LAB — DIFFERENTIAL
Basophils Relative: 0 % (ref 0–1)
Eosinophils Absolute: 0.1 10*3/uL (ref 0.0–0.7)
Monocytes Relative: 5 % (ref 3–12)
Neutrophils Relative %: 78 % — ABNORMAL HIGH (ref 43–77)

## 2011-03-16 LAB — COMPREHENSIVE METABOLIC PANEL
ALT: 19 U/L (ref 0–35)
Alkaline Phosphatase: 117 U/L (ref 39–117)
CO2: 22 mEq/L (ref 19–32)
Chloride: 108 mEq/L (ref 96–112)
GFR calc non Af Amer: >60 mL/min (ref >60)
Glucose, Bld: 91 mg/dL (ref 70–99)
Potassium: 4.1 mEq/L (ref 3.5–5.1)
Sodium: 140 mEq/L (ref 135–145)

## 2011-03-16 LAB — URINALYSIS, ROUTINE W REFLEX MICROSCOPIC
Nitrite: NEGATIVE
Specific Gravity, Urine: 1.036 — ABNORMAL HIGH (ref 1.005–1.030)
pH: 6.5 (ref 5.0–8.0)

## 2011-03-16 LAB — PREGNANCY, URINE: Preg Test, Ur: NEGATIVE

## 2011-03-16 LAB — CBC
Hemoglobin: 12.1 g/dL (ref 12.0–15.0)
RBC: 4.78 MIL/uL (ref 3.87–5.11)

## 2011-03-22 LAB — CBC
Hemoglobin: 9.4 — ABNORMAL LOW
MCV: 87.4
Platelets: 287
Platelets: 310
Platelets: 341 — ABNORMAL HIGH
RDW: 15 — ABNORMAL HIGH
RDW: 15.7 — ABNORMAL HIGH
WBC: 9.4

## 2011-03-22 LAB — URINALYSIS, ROUTINE W REFLEX MICROSCOPIC
Bilirubin Urine: NEGATIVE
Glucose, UA: NEGATIVE
Ketones, ur: NEGATIVE
pH: 6.5

## 2011-03-22 LAB — CREATININE, SERUM
Creatinine, Ser: 0.77
GFR calc non Af Amer: >60

## 2011-03-22 LAB — GRAM STAIN

## 2011-03-22 LAB — RH IMMUNE GLOB WKUP(>/=20WKS)(NOT WOMEN'S HOSP): Fetal Screen: NEGATIVE

## 2011-03-22 LAB — RPR: RPR Ser Ql: NONREACTIVE

## 2011-03-22 LAB — URINE MICROSCOPIC-ADD ON

## 2011-03-26 ENCOUNTER — Emergency Department (HOSPITAL_COMMUNITY)
Admission: EM | Admit: 2011-03-26 | Discharge: 2011-03-26 | Disposition: A | Discharge status: Home / Self Care | Payer: Medicaid Other | Attending: Emergency Medicine | Admitting: Emergency Medicine

## 2011-03-26 DIAGNOSIS — R21 Rash and other nonspecific skin eruption: Secondary | ICD-10-CM | POA: Insufficient documentation

## 2011-03-26 DIAGNOSIS — B354 Tinea corporis: Secondary | ICD-10-CM | POA: Insufficient documentation

## 2011-03-26 DIAGNOSIS — L299 Pruritus, unspecified: Secondary | ICD-10-CM | POA: Insufficient documentation

## 2011-04-04 ENCOUNTER — Emergency Department (HOSPITAL_COMMUNITY)
Admission: EM | Admit: 2011-04-04 | Discharge: 2011-04-04 | Discharge status: Against Medical Advice | Payer: Medicaid Other | Attending: Emergency Medicine | Admitting: Emergency Medicine

## 2011-04-04 DIAGNOSIS — Z0389 Encounter for observation for other suspected diseases and conditions ruled out: Secondary | ICD-10-CM | POA: Insufficient documentation

## 2011-04-12 ENCOUNTER — Ambulatory Visit: Payer: Medicaid Other

## 2011-07-13 ENCOUNTER — Ambulatory Visit: Payer: Medicaid Other | Admitting: Family Medicine

## 2011-07-13 ENCOUNTER — Emergency Department (HOSPITAL_COMMUNITY)
Admission: EM | Admit: 2011-07-13 | Discharge: 2011-07-13 | Disposition: A | Discharge status: Home / Self Care | Payer: Self-pay | Attending: Emergency Medicine | Admitting: Emergency Medicine

## 2011-07-13 ENCOUNTER — Encounter (HOSPITAL_COMMUNITY): Payer: Self-pay

## 2011-07-13 DIAGNOSIS — R112 Nausea with vomiting, unspecified: Secondary | ICD-10-CM | POA: Insufficient documentation

## 2011-07-13 LAB — URINALYSIS, ROUTINE W REFLEX MICROSCOPIC
Bilirubin Urine: NEGATIVE
Hgb urine dipstick: NEGATIVE
Ketones, ur: 40 mg/dL — AB
Nitrite: NEGATIVE
Urobilinogen, UA: 0.2 mg/dL (ref 0.0–1.0)

## 2011-07-13 MED ORDER — ONDANSETRON HCL 4 MG PO TABS: 4.0000 mg | ORAL_TABLET | Freq: Four times a day (QID) | ORAL | Status: AC

## 2011-07-13 MED ORDER — ONDANSETRON 4 MG PO TBDP
4.0000 mg | ORAL_TABLET | Freq: Once | ORAL | Status: AC
  Administered 2011-07-13: 4 mg via ORAL
  Filled 2011-07-13: qty 1

## 2011-07-13 NOTE — ED Notes (Signed)
Woke up this am with n/v  Denies any abdominal pain

## 2011-07-13 NOTE — ED Provider Notes (Signed)
History     CSN: [id number]  Arrival date & time 07/13/11  1701   First MD Initiated Contact with Patient 07/13/11 1753      Chief Complaint  Patient presents with  . Emesis   patient began vomiting. This morning. She's had no abdominal pain. She states she was not sure she could be pregnant. She did home pregnant test, but was unsure of the results. Her last menstrual cycle was at the end of last month. She's had 2 previous pregnancies and one elective abortion. She has no dysuria, no bleeding, no fevers. No body aches.  (Consider location/radiation/quality/duration/timing/severity/associated sxs/prior treatment) HPI  Past Medical History  Diagnosis Date  . Thyroid disease     History reviewed. No pertinent past surgical history.  No family history on file.  History  Substance Use Topics  . Smoking status: Never Smoker   . Smokeless tobacco: Not on file  . Alcohol Use: No    OB History    Grav Para Term Preterm Abortions TAB SAB Ect Mult Living                  Review of Systems  All other systems reviewed and are negative.    Allergies  Sulfamethoxazole w/trimethoprim  Home Medications  No current outpatient prescriptions on file.  BP 91/63  Pulse 90  Temp(Src) 98.9 F (37.2 C) (Oral)  Resp 14  Ht 5' (1.524 m)  Wt 189 lb (85.73 kg)  BMI 36.91 kg/m2  SpO2 99%  LMP 06/11/2011  Physical Exam  Nursing note and vitals reviewed. Constitutional: She appears well-developed and well-nourished. No distress.  HENT:  Head: Normocephalic.  Eyes: Pupils are equal, round, and reactive to light.  Cardiovascular: Normal heart sounds.   Pulmonary/Chest: Breath sounds normal.  Abdominal: Soft.  Musculoskeletal: Normal range of motion.  Neurological: She is alert.  Skin: Skin is warm and dry.    ED Course  Procedures (including critical care time)   Labs Reviewed  URINALYSIS, ROUTINE W REFLEX MICROSCOPIC   No results found.   No diagnosis  found. 2Patient is seen and examined, initial history and physical is completed. Evaluation initiated   MDM         7:07 PM  Results for orders placed during the hospital encounter of 07/13/11  URINALYSIS, ROUTINE W REFLEX MICROSCOPIC      Component Value Range   Color, Urine YELLOW  YELLOW    APPearance CLEAR  CLEAR    Specific Gravity, Urine 1.026  1.005 - 1.030    pH 7.0  5.0 - 8.0    Glucose, UA NEGATIVE  NEGATIVE (mg/dL)   Hgb urine dipstick NEGATIVE  NEGATIVE    Bilirubin Urine NEGATIVE  NEGATIVE    Ketones, ur 40 (*) NEGATIVE (mg/dL)   Protein, ur NEGATIVE  NEGATIVE (mg/dL)   Urobilinogen, UA 0.2  0.0 - 1.0 (mg/dL)   Nitrite NEGATIVE  NEGATIVE    Leukocytes, UA NEGATIVE  NEGATIVE   POCT PREGNANCY, URINE      Component Value Range   Preg Test, Ur NEGATIVE  NEGATIVE    No results found.  Patient given results of her urine and her negative pregnancy test. If she feels better at this time. She did not require any additional studies at this time. Patient is comfortable with that. She is requesting a work note for earlier today because she apparently did not go in. She is discharged home in stable improved condition. Patient was told return to ED  for any concerns such as abdominal pain, fever, persistent vomiting, etc.  Lorelle Gibbs. Patrica Duel, MD 07/13/11 1907

## 2011-10-24 ENCOUNTER — Encounter: Payer: Medicaid Other | Admitting: Family Medicine

## 2011-10-26 ENCOUNTER — Encounter: Payer: Medicaid Other | Admitting: Family Medicine

## 2012-01-04 ENCOUNTER — Encounter: Payer: Self-pay | Admitting: Family Medicine

## 2012-01-04 ENCOUNTER — Other Ambulatory Visit (HOSPITAL_COMMUNITY)
Admission: RE | Admit: 2012-01-04 | Discharge: 2012-01-04 | Disposition: A | Discharge status: Home / Self Care | Payer: Medicaid Other | Source: Ambulatory Visit | Attending: Family Medicine | Admitting: Family Medicine

## 2012-01-04 ENCOUNTER — Ambulatory Visit (INDEPENDENT_AMBULATORY_CARE_PROVIDER_SITE_OTHER): Payer: Medicaid Other | Admitting: Family Medicine

## 2012-01-04 VITALS — BP 108/76 | HR 84 | Temp 98.4°F | Ht 60.0 in | Wt 219.4 lb

## 2012-01-04 DIAGNOSIS — N760 Acute vaginitis: Secondary | ICD-10-CM

## 2012-01-04 DIAGNOSIS — Z01419 Encounter for gynecological examination (general) (routine) without abnormal findings: Secondary | ICD-10-CM | POA: Insufficient documentation

## 2012-01-04 DIAGNOSIS — Z3009 Encounter for other general counseling and advice on contraception: Secondary | ICD-10-CM

## 2012-01-04 DIAGNOSIS — Z2089 Contact with and (suspected) exposure to other communicable diseases: Secondary | ICD-10-CM

## 2012-01-04 DIAGNOSIS — Z124 Encounter for screening for malignant neoplasm of cervix: Secondary | ICD-10-CM

## 2012-01-04 DIAGNOSIS — Z23 Encounter for immunization: Secondary | ICD-10-CM

## 2012-01-04 DIAGNOSIS — E669 Obesity, unspecified: Secondary | ICD-10-CM

## 2012-01-04 DIAGNOSIS — Z113 Encounter for screening for infections with a predominantly sexual mode of transmission: Secondary | ICD-10-CM | POA: Insufficient documentation

## 2012-01-04 DIAGNOSIS — Z202 Contact with and (suspected) exposure to infections with a predominantly sexual mode of transmission: Secondary | ICD-10-CM

## 2012-01-04 DIAGNOSIS — Z20828 Contact with and (suspected) exposure to other viral communicable diseases: Secondary | ICD-10-CM

## 2012-01-04 LAB — POCT WET PREP (WET MOUNT): Clue Cells Wet Prep Whiff POC: POSITIVE

## 2012-01-04 LAB — CBC WITH DIFFERENTIAL/PLATELET
Basophils Absolute: 0 10*3/uL (ref 0.0–0.1)
Eosinophils Relative: 4 % (ref 0–5)
Lymphocytes Relative: 41 % (ref 12–46)
Lymphs Abs: 1.9 10*3/uL (ref 0.7–4.0)
MCV: 81.3 fL (ref 78.0–100.0)
Neutro Abs: 2.3 10*3/uL (ref 1.7–7.7)
Neutrophils Relative %: 48 % (ref 43–77)
Platelets: 387 10*3/uL (ref 150–400)
RBC: 4.33 MIL/uL (ref 3.87–5.11)
RDW: 14.1 % (ref 11.5–15.5)
WBC: 4.7 10*3/uL (ref 4.0–10.5)

## 2012-01-04 LAB — LIPID PANEL
Cholesterol: 176 mg/dL (ref 0–200)
HDL: 39 mg/dL — ABNORMAL LOW (ref >39)
Total CHOL/HDL Ratio: 4.5 Ratio
Triglycerides: 43 mg/dL (ref <150)

## 2012-01-04 LAB — HIV ANTIBODY (ROUTINE TESTING W REFLEX): HIV: NONREACTIVE

## 2012-01-04 LAB — COMPREHENSIVE METABOLIC PANEL
Albumin: 4.1 g/dL (ref 3.5–5.2)
Alkaline Phosphatase: 111 U/L (ref 39–117)
BUN: 10 mg/dL (ref 6–23)
CO2: 24 mEq/L (ref 19–32)
Glucose, Bld: 78 mg/dL (ref 70–99)
Potassium: 3.9 mEq/L (ref 3.5–5.3)

## 2012-01-04 LAB — TSH: TSH: 0.685 u[IU]/mL (ref 0.350–4.500)

## 2012-01-04 LAB — RPR

## 2012-01-04 NOTE — Assessment & Plan Note (Signed)
Counseled.  Will screen for DM, lipids.  ? History of thyroid problems - will check TSH

## 2012-01-04 NOTE — Assessment & Plan Note (Signed)
Discussed options,  She would like nexplanon.  Discussed risks, benefits, side effects in office, sent her home with informational handout.  Specifically discussed risk of being pregnant at time of insertion.  Advised no sex or protected sex until insertion.

## 2012-01-04 NOTE — Progress Notes (Signed)
  Subjective:    Patient ID: Alice Frye, female    DOB: 01-24-1991, 21 y.o.   MRN: [id number]  HPIAnnual physical  Wants to discuss contraception.  Knows several people who have Implanon and would like that.  Is unsure about mirena as her sister had one and did not like it.   Annual Gynecological Exam   Wt Readings from Last 3 Encounters:  01/04/12 219 lb 6.4 oz (99.519 kg)  07/13/11 189 lb (85.73 kg)  06/15/10 202 lb (91.627 kg)   Last period: 12/30/2011 Regular periods: yes Heavy bleeding: no  Sexually active: yes Birth control or hormonal therapy:none Hx of STD: Patient desires STD screening Dyspareunia: No Hot flashes: No Vaginal discharge: yes Dysuria:No no  Last mammogram: none Breast mass or concerns: No Last Pap: previously normal, uknwon  History of abnormal pap: remote   FH of breast, uterine, ovarian, colon cancer: No   Patient Information Form: Screening and ROS  AUDIT-C Score: 0 Do you feel safe in relationships? yes PHQ-2:negative  Review of Symptoms  General:  Negative for nexplained weight loss, fever Skin: Negative for new or changing mole, sore that won't heal HEENT: Negative for trouble hearing, trouble seeing, ringing in ears, mouth sores, hoarseness, change in voice, dysphagia. CV:  Negative for chest pain, dyspnea, edema, palpitations Resp: Negative for cough, dyspnea, hemoptysis GI: Negative for nausea, vomiting, diarrhea, constipation, abdominal pain, melena, hematochezia. GU: Negative for dysuria, incontinence, urinary hesitance, hematuria, vaginal or penile discharge, polyuria, sexual difficulty, lumps in testicle or breasts MSK: Negative for muscle cramps or aches, joint pain or swelling Neuro: Negative for headaches, weakness, numbness, dizziness, passing out/fainting Psych: Negative for depression, anxiety, memory problems     Review of Systems See HPI    Objective:   Physical Exam  GEN: Alert & Oriented, No acute  distress CV:  Regular Rate & Rhythm, no murmur Respiratory:  Normal work of breathing, CTAB Abd:  + BS, soft, no tenderness to palpation Ext: no pre-tibial edema Pelvic Exam:        External: normal female genitalia without lesions or masses        Vagina: normal without lesions or masses        Cervix: normal without lesions or masses        Adnexa: normal bimanual exam without masses or fullness        Uterus: normal by palpation        Pap smear: performed        Samples for Wet prep, GC/Chlamydia obtained       Assessment & Plan:

## 2012-01-04 NOTE — Patient Instructions (Addendum)
Consider using emergency contraception  Always use condoms to prevent disease!  Make follow-up appointment to put in Nexplanon  See informational handout to review our counseling for Nexplanon

## 2012-01-04 NOTE — Assessment & Plan Note (Signed)
Pap and STD screening today. (history of std).  TDAP given.

## 2012-01-09 ENCOUNTER — Encounter: Payer: Self-pay | Admitting: Family Medicine

## 2012-01-09 ENCOUNTER — Ambulatory Visit (INDEPENDENT_AMBULATORY_CARE_PROVIDER_SITE_OTHER): Payer: Medicaid Other | Admitting: Family Medicine

## 2012-01-09 VITALS — BP 112/72 | HR 72 | Temp 98.1°F | Ht 61.0 in | Wt 216.7 lb

## 2012-01-09 DIAGNOSIS — Z309 Encounter for contraceptive management, unspecified: Secondary | ICD-10-CM

## 2012-01-09 DIAGNOSIS — Z3046 Encounter for surveillance of implantable subdermal contraceptive: Secondary | ICD-10-CM

## 2012-01-09 DIAGNOSIS — Z30017 Encounter for initial prescription of implantable subdermal contraceptive: Secondary | ICD-10-CM

## 2012-01-09 MED ORDER — ETONOGESTREL 68 MG ~~LOC~~ IMPL
68.0000 mg | DRUG_IMPLANT | Freq: Once | SUBCUTANEOUS | Status: AC
  Administered 2012-01-09: 68 mg via SUBCUTANEOUS

## 2012-01-09 NOTE — Assessment & Plan Note (Signed)
nexplanon inserted.  Follow-up annually as needed.  Discussed aftercare

## 2012-01-09 NOTE — Progress Notes (Signed)
  Subjective:    Patient ID: Alice Frye, female    DOB: 1990-12-19, 21 y.o.   MRN: [id number]  HPI  Here for implanon insertion.  Patient has reviewed information since last appt and would like to proceed with insertion.  Review of Systems     Objective:   Physical Exam        Assessment & Plan:  Nexplanon Insertion  Indications, contraindications, side effects, complications reviewed.  Non-dominant arm identified and marked for insertion. Time out performed.  Area was prepped in usual manner 2ml of 1% lidocaine injected along track of insertion.  Nexplanon inserted as per package insert.   Patient tolerated procedure well. Negligible blood loss.  Steri-strip used to close site, pressure bandage applied.  Provider and patient able to palpate rod.  Given aftercare instructions.

## 2012-01-09 NOTE — Patient Instructions (Addendum)
If you have signs of infection such as redness, pus, bleeding, please come back for recheck   Follow-up annually or sooner if needed

## 2012-01-10 ENCOUNTER — Telehealth: Payer: Self-pay | Admitting: Family Medicine

## 2012-01-10 DIAGNOSIS — A599 Trichomoniasis, unspecified: Secondary | ICD-10-CM | POA: Insufficient documentation

## 2012-01-10 MED ORDER — METRONIDAZOLE 500 MG PO TABS: 2000.0000 mg | ORAL_TABLET | Freq: Once | ORAL | Status: AC

## 2012-01-10 NOTE — Assessment & Plan Note (Signed)
Called patient, discuss positive trichomonas.  WIll rx treatment, advised to avoid sex until treated for 1 week, advised partners to get tested and treated 

## 2012-01-10 NOTE — Telephone Encounter (Signed)
Called patient, discuss positive trichomonas.  WIll rx treatment, advised to avoid sex until treated for 1 week, advised partners to get tested and treated

## 2012-03-04 ENCOUNTER — Ambulatory Visit: Payer: Medicaid Other | Admitting: Family Medicine

## 2012-03-13 ENCOUNTER — Ambulatory Visit (INDEPENDENT_AMBULATORY_CARE_PROVIDER_SITE_OTHER): Payer: Medicaid Other | Admitting: Family Medicine

## 2012-03-13 ENCOUNTER — Encounter: Payer: Self-pay | Admitting: Family Medicine

## 2012-03-13 VITALS — Temp 99.1°F | Ht 61.0 in | Wt 220.0 lb

## 2012-03-13 DIAGNOSIS — Z3169 Encounter for other general counseling and advice on procreation: Secondary | ICD-10-CM

## 2012-03-13 DIAGNOSIS — Z3046 Encounter for surveillance of implantable subdermal contraceptive: Secondary | ICD-10-CM | POA: Insufficient documentation

## 2012-03-13 MED ORDER — PRENATAL VITAMINS 0.8 MG PO TABS: 1.0000 | ORAL_TABLET | Freq: Every day | ORAL | Status: DC

## 2012-03-13 NOTE — Progress Notes (Signed)
  Subjective:    Patient ID: Alice Frye, female    DOB: 10-23-1990, 21 y.o.   MRN: [id number]  HPI patient here for preconception counseling and to remove nexplanon  Was placed 2 months ago.  Since then, has decided that she wishes to conceive now.  Implanon or Nexplanon Removal  Patient given informed consent for removal of her Nexplanon.  Signed copy in the chart.  Time out recorded.  Implanon site identified and able to be palpated.   Area prepped in usual sterile fashon.  One cc of 1% lidocaine was used to anesthetize the area at the distal end of the implant. A small stab incision was made near implant on the distal portion.   The implanon rod was grasped using hemostats and removed without difficulty.   Rod measured at 4cm to ensure complete removal.   Steri-strips were applied over the small incision.   A pressure bandage was applied to reduce any bruising.   There was less than 3 cc blood loss. There were no complications.  The patient tolerated the procedure well and was given post procedure instructions.   Review of Systems     Objective:   Physical Exam        Assessment & Plan:

## 2012-03-13 NOTE — Patient Instructions (Addendum)
Take prenatal vitamins daily  If you notice bleeding, swelling, redness, or pus drainage at the site, pelase return for recheck

## 2012-03-13 NOTE — Assessment & Plan Note (Signed)
Removed per patient request as she desires to conceive immediately.

## 2012-03-13 NOTE — Assessment & Plan Note (Signed)
rxed prenatal vitamins, discussed healthy weight, avoidance of teratogens.

## 2012-08-15 ENCOUNTER — Other Ambulatory Visit (HOSPITAL_COMMUNITY)
Admission: RE | Admit: 2012-08-15 | Discharge: 2012-08-15 | Disposition: A | Discharge status: Home / Self Care | Payer: Medicaid Other | Source: Ambulatory Visit | Attending: Family Medicine | Admitting: Family Medicine

## 2012-08-15 ENCOUNTER — Ambulatory Visit (INDEPENDENT_AMBULATORY_CARE_PROVIDER_SITE_OTHER): Payer: Medicaid Other | Admitting: Family Medicine

## 2012-08-15 ENCOUNTER — Encounter: Payer: Self-pay | Admitting: Family Medicine

## 2012-08-15 VITALS — BP 111/72 | HR 73 | Ht 61.0 in | Wt 221.0 lb

## 2012-08-15 DIAGNOSIS — A499 Bacterial infection, unspecified: Secondary | ICD-10-CM

## 2012-08-15 DIAGNOSIS — N898 Other specified noninflammatory disorders of vagina: Secondary | ICD-10-CM

## 2012-08-15 DIAGNOSIS — Z3009 Encounter for other general counseling and advice on contraception: Secondary | ICD-10-CM

## 2012-08-15 DIAGNOSIS — Z309 Encounter for contraceptive management, unspecified: Secondary | ICD-10-CM

## 2012-08-15 DIAGNOSIS — IMO0001 Reserved for inherently not codable concepts without codable children: Secondary | ICD-10-CM

## 2012-08-15 DIAGNOSIS — N760 Acute vaginitis: Secondary | ICD-10-CM

## 2012-08-15 DIAGNOSIS — N72 Inflammatory disease of cervix uteri: Secondary | ICD-10-CM

## 2012-08-15 LAB — POCT WET PREP (WET MOUNT): Clue Cells Wet Prep Whiff POC: POSITIVE

## 2012-08-15 MED ORDER — METRONIDAZOLE 500 MG PO TABS: 500.0000 mg | ORAL_TABLET | Freq: Two times a day (BID) | ORAL | Status: DC

## 2012-08-15 MED ORDER — FLUCONAZOLE 150 MG PO TABS: 150.0000 mg | ORAL_TABLET | Freq: Once | ORAL | Status: DC

## 2012-08-15 NOTE — Patient Instructions (Addendum)
Make appointment to insert nexplanon the week of your period  No sex from the time you start your period until we put in your birth control.  We discussed keeping it in for at least a year, but it is good for 3 years

## 2012-08-15 NOTE — Progress Notes (Signed)
  Subjective:    Patient ID: Alice Frye, female    DOB: [birth year], 22 y.o.   MRN: [id number]  HPI  Vaginal discharge:  No abdominal pain, fever, recent abx.  New sexual partner, not using condoms.  Desires STD check  Contraception.  Desires long term contraception.  Does not anticipate pregnancy for several years.  Has nexplanon but removed as she was in a relationship atthat time and was planning pregnancy.  Had nexplanon for several months and was happy with it.   Reports neg HIV and RPR in past 6 months Review of Systems    see HPI Objective:   Physical Exam GEN: Alert & Oriented, No acute distress Pelvic Exam:        External: normal female genitalia without lesions or masses        Vagina: normal without lesions or masses        Cervix: normal without lesions or masses        Samples for Wet prep, GC/Chlamydia obtained         Assessment & Plan:

## 2012-08-15 NOTE — Assessment & Plan Note (Signed)
Patient committed to keeping in nexplanon for at least a year.  She will schedule appt for insertion

## 2012-08-19 ENCOUNTER — Encounter: Payer: Self-pay | Admitting: Family Medicine

## 2012-08-29 ENCOUNTER — Encounter: Payer: Self-pay | Admitting: Family Medicine

## 2012-08-29 ENCOUNTER — Ambulatory Visit (INDEPENDENT_AMBULATORY_CARE_PROVIDER_SITE_OTHER): Payer: Medicaid Other | Admitting: Family Medicine

## 2012-08-29 VITALS — BP 117/78 | HR 74 | Temp 98.5°F | Ht 61.0 in | Wt 220.5 lb

## 2012-08-29 DIAGNOSIS — Z30017 Encounter for initial prescription of implantable subdermal contraceptive: Secondary | ICD-10-CM

## 2012-08-29 DIAGNOSIS — Z3046 Encounter for surveillance of implantable subdermal contraceptive: Secondary | ICD-10-CM

## 2012-08-29 DIAGNOSIS — Z3009 Encounter for other general counseling and advice on contraception: Secondary | ICD-10-CM

## 2012-08-29 MED ORDER — ETONOGESTREL 68 MG ~~LOC~~ IMPL
68.0000 mg | DRUG_IMPLANT | Freq: Once | SUBCUTANEOUS | Status: AC
  Administered 2012-08-29: 68 mg via SUBCUTANEOUS

## 2012-08-29 NOTE — Progress Notes (Signed)
  Subjective:    Patient ID: Alice Frye, female    DOB: 11-20-1990, 22 y.o.   MRN: [id number]  HPI Nexplanon Insertion  Indications, contraindications, side effects, complications reviewed.  Non-dominant arm identified and marked for insertion. Time out performed.  Area was prepped in usual manner 2ml of 1% lidocaine injected along track of insertion.  Implanon inserted as per package insert.   Patient tolerated procedure well. Negligible blood loss.  Steri-strip used to close site, pressure bandage applied.  Provider and patient able to palpate rod.  Given aftercare instructions     Review of Systems     Objective:   Physical Exam Pregnancy test normal Patient is on normally scheduled menses       Assessment & Plan:

## 2012-08-29 NOTE — Patient Instructions (Addendum)
If you notice pain, redness, or other concerns of infection at the insertion site, please schedule follow-up

## 2012-08-29 NOTE — Assessment & Plan Note (Signed)
Successfully inserted.  Discussed aftercare and precautions to return sooner.  Follow-up annually

## 2012-10-22 ENCOUNTER — Encounter (HOSPITAL_COMMUNITY): Payer: Self-pay | Admitting: *Deleted

## 2012-10-22 ENCOUNTER — Emergency Department (HOSPITAL_COMMUNITY): Payer: Self-pay

## 2012-10-22 ENCOUNTER — Emergency Department (HOSPITAL_COMMUNITY)
Admission: EM | Admit: 2012-10-22 | Discharge: 2012-10-22 | Disposition: A | Discharge status: Home / Self Care | Payer: Self-pay | Attending: Emergency Medicine | Admitting: Emergency Medicine

## 2012-10-22 DIAGNOSIS — R229 Localized swelling, mass and lump, unspecified: Secondary | ICD-10-CM | POA: Insufficient documentation

## 2012-10-22 DIAGNOSIS — W230XXA Caught, crushed, jammed, or pinched between moving objects, initial encounter: Secondary | ICD-10-CM | POA: Insufficient documentation

## 2012-10-22 DIAGNOSIS — Y929 Unspecified place or not applicable: Secondary | ICD-10-CM | POA: Insufficient documentation

## 2012-10-22 DIAGNOSIS — Y9389 Activity, other specified: Secondary | ICD-10-CM | POA: Insufficient documentation

## 2012-10-22 DIAGNOSIS — S6990XA Unspecified injury of unspecified wrist, hand and finger(s), initial encounter: Secondary | ICD-10-CM | POA: Insufficient documentation

## 2012-10-22 DIAGNOSIS — S6992XA Unspecified injury of left wrist, hand and finger(s), initial encounter: Secondary | ICD-10-CM

## 2012-10-22 MED ORDER — IBUPROFEN 800 MG PO TABS
800.0000 mg | ORAL_TABLET | Freq: Once | ORAL | Status: AC
  Administered 2012-10-22: 800 mg via ORAL
  Filled 2012-10-22: qty 1

## 2012-10-22 NOTE — ED Notes (Signed)
Pt reports smashing left ring finger into car door at 1500 today. Slight swelling noted.

## 2012-10-22 NOTE — ED Provider Notes (Signed)
History     CSN: [id number]  Arrival date & time 10/22/12  1512   First MD Initiated Contact with Patient 10/22/12 [phone]      Chief Complaint  Patient presents with  . Finger Injury    (Consider location/radiation/quality/duration/timing/severity/associated sxs/prior treatment) HPI Comments: 22 year old female presents to the emergency department complaining of pain in her left ring finger after "smashing it" in the car door around 3:00 this afternoon, prior to arrival. States the door closed completely and was locked, it took a while for them able to open the door. Currently she is complaining of severe pain to the tip of her left ring finger, rated 10 out of 10. She is beginning to develop some numbness to the tip of her ring finger along with swelling. She has not tried any alleviating factors for her pain. No open wounds. Right hand dominant.  The history is provided by the patient.    History reviewed. No pertinent past medical history.  History reviewed. No pertinent past surgical history.  Family History  Problem Relation Age of Onset  . Diabetes Father   . Diabetes Sister   . Heart disease Neg Hx   . Stroke Neg Hx   . Alcohol abuse Neg Hx     History  Substance Use Topics  . Smoking status: Never Smoker   . Smokeless tobacco: Not on file  . Alcohol Use: 0.6 oz/week    1 Shots of liquor per week    OB History   Grav Para Term Preterm Abortions TAB SAB Ect Mult Living                  Review of Systems  Musculoskeletal: Positive for joint swelling.       Positive for left ring finger pain.  Skin: Negative for wound.    Allergies  Review of patient's allergies indicates no known allergies.  Home Medications  No current outpatient prescriptions on file.  BP 128/74  Pulse 89  Temp(Src) 99.1 F (37.3 C) (Oral)  Resp 16  SpO2 98%  LMP 09/22/2012  Physical Exam  Nursing note and vitals reviewed. Constitutional: She is oriented to person, place, and  time. She appears well-developed and well-nourished. No distress.  HENT:  Head: Normocephalic and atraumatic.  Mouth/Throat: Oropharynx is clear and moist.  Eyes: Conjunctivae are normal.  Neck: Normal range of motion. Neck supple.  Cardiovascular: Normal rate, regular rhythm and normal heart sounds.   Pulmonary/Chest: Effort normal and breath sounds normal.  Abdominal: Soft. Bowel sounds are normal. There is no tenderness.  Musculoskeletal: Normal range of motion. She exhibits no edema.       Hands: Full ROM of left hand and fingers. Capillary refill < 3 seconds.  Neurological: She is alert and oriented to person, place, and time. No sensory deficit.  Skin: Skin is warm, dry and intact. She is not diaphoretic.  Psychiatric: She has a normal mood and affect. Her behavior is normal.    ED Course  Procedures (including critical care time)  Labs Reviewed - No data to display No results found.   1. Finger injury, left, initial encounter       MDM  22 year old female with injury to her left ring finger. No acute fracture seen on x-ray. Nail intact. Finger splint given for comfort. Advised rest, ice and elevation of her finger. Ibuprofen for pain. Patient states understanding of plan and is agreeable.        Trevor Mace, PA-C 10/22/12  1713 

## 2012-10-23 NOTE — ED Provider Notes (Signed)
Medical screening examination/treatment/procedure(s) were performed by non-physician practitioner and as supervising physician I was immediately available for consultation/collaboration.    Blia Totman R Simona Rocque, MD 10/23/12 0017 

## 2013-01-09 ENCOUNTER — Encounter (HOSPITAL_COMMUNITY): Payer: Self-pay | Admitting: Emergency Medicine

## 2013-01-09 ENCOUNTER — Emergency Department (HOSPITAL_COMMUNITY)
Admission: EM | Admit: 2013-01-09 | Discharge: 2013-01-09 | Disposition: A | Discharge status: Home / Self Care | Payer: Self-pay | Attending: Emergency Medicine | Admitting: Emergency Medicine

## 2013-01-09 DIAGNOSIS — R300 Dysuria: Secondary | ICD-10-CM | POA: Insufficient documentation

## 2013-01-09 DIAGNOSIS — Z3202 Encounter for pregnancy test, result negative: Secondary | ICD-10-CM | POA: Insufficient documentation

## 2013-01-09 DIAGNOSIS — N390 Urinary tract infection, site not specified: Secondary | ICD-10-CM | POA: Insufficient documentation

## 2013-01-09 DIAGNOSIS — R3915 Urgency of urination: Secondary | ICD-10-CM | POA: Insufficient documentation

## 2013-01-09 LAB — PREGNANCY, URINE: Preg Test, Ur: NEGATIVE

## 2013-01-09 LAB — URINALYSIS, ROUTINE W REFLEX MICROSCOPIC
Bilirubin Urine: NEGATIVE
Glucose, UA: NEGATIVE mg/dL
Hgb urine dipstick: NEGATIVE
Protein, ur: NEGATIVE mg/dL
Urobilinogen, UA: 1 mg/dL (ref 0.0–1.0)

## 2013-01-09 LAB — URINE MICROSCOPIC-ADD ON

## 2013-01-09 MED ORDER — CEPHALEXIN 500 MG PO CAPS: 500.0000 mg | ORAL_CAPSULE | Freq: Three times a day (TID) | ORAL | Status: DC

## 2013-01-09 MED ORDER — PHENAZOPYRIDINE HCL 200 MG PO TABS
200.0000 mg | ORAL_TABLET | Freq: Three times a day (TID) | ORAL | Status: DC
  Administered 2013-01-09: 200 mg via ORAL
  Filled 2013-01-09: qty 1

## 2013-01-09 MED ORDER — PHENAZOPYRIDINE HCL 200 MG PO TABS: 200.0000 mg | ORAL_TABLET | Freq: Three times a day (TID) | ORAL | Status: DC

## 2013-01-09 MED ORDER — CEPHALEXIN 500 MG PO CAPS
500.0000 mg | ORAL_CAPSULE | Freq: Once | ORAL | Status: AC
  Administered 2013-01-09: 500 mg via ORAL
  Filled 2013-01-09: qty 1

## 2013-01-09 NOTE — ED Notes (Signed)
Pt states she feels like she has a UTI  Pt states she is having frequency  Denies pain or burning with urination at this time

## 2013-01-09 NOTE — ED Provider Notes (Signed)
CSN: [id number]     Arrival date & time 01/09/13  2017 History     First MD Initiated Contact with Patient 01/09/13 2029     Chief Complaint  Patient presents with  . Urinary Frequency   (Consider location/radiation/quality/duration/timing/severity/associated sxs/prior Treatment) HPI Alice Frye is a 22 y.o. female who presents to ED with complaint of urinary frequency. States symptoms began several days ago, worsening today. States mild dysuria, states feels like has to constantly urinate, very little comes out. Denies hematuria, denies flank pain, no nausea, vomiting, diarrhea.  No fever. Did not try any medications. No vaginal pain, discharge, bleeding.    History reviewed. No pertinent past medical history. History reviewed. No pertinent past surgical history. Family History  Problem Relation Age of Onset  . Diabetes Father   . Diabetes Sister   . Heart disease Neg Hx   . Stroke Neg Hx   . Alcohol abuse Neg Hx    History  Substance Use Topics  . Smoking status: Never Smoker   . Smokeless tobacco: Not on file  . Alcohol Use: 0.6 oz/week    1 Shots of liquor per week     Comment: occ   OB History   Grav Para Term Preterm Abortions TAB SAB Ect Mult Living                 Review of Systems  Constitutional: Negative for fever and chills.  Respiratory: Negative.   Cardiovascular: Negative.   Gastrointestinal: Negative for abdominal pain.  Genitourinary: Positive for dysuria, urgency and frequency. Negative for flank pain, vaginal bleeding, vaginal discharge and pelvic pain.  Neurological: Negative for headaches.  All other systems reviewed and are negative.    Allergies  Review of patient's allergies indicates no known allergies.  Home Medications   Current Outpatient Rx  Name  Route  Sig  Dispense  Refill  . etonogestrel (IMPLANON) 68 MG IMPL implant   Subcutaneous   Inject 1 each into the skin once. February 2014          BP 127/76  Pulse 94   Temp(Src) 99.1 F (37.3 C) (Oral)  Resp 18  Ht 5' (1.524 m)  Wt 190 lb (86.183 kg)  BMI 37.11 kg/m2  SpO2 100% Physical Exam  Nursing note and vitals reviewed. Constitutional: She is oriented to person, place, and time. She appears well-developed and well-nourished. No distress.  Cardiovascular: Normal rate, regular rhythm and normal heart sounds.   Pulmonary/Chest: Effort normal and breath sounds normal. No respiratory distress. She has no wheezes. She has no rales.  Abdominal: Soft. Bowel sounds are normal. She exhibits no distension. There is no tenderness. There is no rebound.  No cva tenderness  Musculoskeletal: She exhibits no edema.  Neurological: She is alert and oriented to person, place, and time.    ED Course   Procedures (including critical care time)  Labs Reviewed  URINALYSIS, ROUTINE W REFLEX MICROSCOPIC - Abnormal; Notable for the following:    APPearance CLOUDY (*)    Specific Gravity, Urine 1.034 (*)    Leukocytes, UA MODERATE (*)    All other components within normal limits  URINE MICROSCOPIC-ADD ON - Abnormal; Notable for the following:    Squamous Epithelial / LPF FEW (*)    All other components within normal limits  PREGNANCY, URINE   1. Urinary tract infection     MDM  Pt with urinary symptoms. Denies vaginal complaints. ua infected. Will start on keflex, pyridium for symptoms,  follow up as needed. No abdominal pain or tenderness on exam. No cva tenderness. Vs normal.   Filed Vitals:   01/09/13 2025  BP: 127/76  Pulse: 94  Temp: 99.1 F (37.3 C)  TempSrc: Oral  Resp: 18  Height: 5' (1.524 m)  Weight: 190 lb (86.183 kg)  SpO2: 100%     Myriam Jacobson Arlo Butt, PA-C 01/10/13 0038

## 2013-01-10 NOTE — ED Provider Notes (Signed)
Medical screening examination/treatment/procedure(s) were conducted as a shared visit with non-physician practitioner(s) or resident  and myself.  I personally evaluated the patient during the encounter and agree with the findings and plan unless otherwise indicated.   Enid Skeens, MD 01/10/13 [phone]

## 2013-03-18 ENCOUNTER — Ambulatory Visit: Payer: Medicaid Other | Admitting: Family Medicine

## 2013-04-21 ENCOUNTER — Ambulatory Visit (INDEPENDENT_AMBULATORY_CARE_PROVIDER_SITE_OTHER): Payer: Medicaid Other | Admitting: *Deleted

## 2013-04-21 DIAGNOSIS — Z111 Encounter for screening for respiratory tuberculosis: Secondary | ICD-10-CM

## 2013-04-21 NOTE — Progress Notes (Signed)
Tuberculin skin test applied to left ventral forearm.  Patient informed to schedule appt for nurse visit in 48-72 hours to have site read.  Richardson, Jeannette Ann, RN   

## 2013-04-22 ENCOUNTER — Encounter: Payer: PRIVATE HEALTH INSURANCE | Admitting: Family Medicine

## 2013-04-24 ENCOUNTER — Ambulatory Visit (INDEPENDENT_AMBULATORY_CARE_PROVIDER_SITE_OTHER): Payer: Self-pay | Admitting: *Deleted

## 2013-04-24 ENCOUNTER — Encounter: Payer: Self-pay | Admitting: *Deleted

## 2013-04-24 ENCOUNTER — Ambulatory Visit (INDEPENDENT_AMBULATORY_CARE_PROVIDER_SITE_OTHER): Payer: Self-pay | Admitting: Family Medicine

## 2013-04-24 ENCOUNTER — Encounter: Payer: Self-pay | Admitting: Family Medicine

## 2013-04-24 VITALS — BP 137/85 | HR 94 | Ht 60.0 in | Wt 224.0 lb

## 2013-04-24 DIAGNOSIS — S8990XA Unspecified injury of unspecified lower leg, initial encounter: Secondary | ICD-10-CM

## 2013-04-24 DIAGNOSIS — Z111 Encounter for screening for respiratory tuberculosis: Secondary | ICD-10-CM

## 2013-04-24 DIAGNOSIS — S99912A Unspecified injury of left ankle, initial encounter: Secondary | ICD-10-CM

## 2013-04-24 LAB — TB SKIN TEST
Induration: 0 mm
TB Skin Test: NEGATIVE

## 2013-04-24 MED ORDER — IBUPROFEN 600 MG PO TABS: 600.0000 mg | ORAL_TABLET | Freq: Three times a day (TID) | ORAL | Status: DC | PRN

## 2013-04-24 NOTE — Progress Notes (Signed)
Subjective:     Patient ID: Alice Frye, female   DOB: September 29, 1990, 22 y.o.   MRN: [id number]  HPI Ankle injury, left: Patient reports she fell off the curb last night. She rolled her ankle outwards, with out audible click or pop. She was able to walk, with pain. She experienced throbbing pain through the night that awakened her. She has some anterior leg pain as well as well as lateral ankle pain that radiates to her foot. She reports her sensation is normal in her feet. She has sprained her ankles bilaterally prior and feels she is clumsy. She has taken nothing for the pain in her ankle. She has not applied ice either.   Review of Systems Negative, with the exception of above mentioned in HPI  Objective:   Physical Exam BP 137/85  Pulse 94  Ht 5' (1.524 m)  Wt 224 lb (101.606 kg)  BMI 43.75 kg/m2 Gen: NAD. Pleasant. Walking on foot with limp.  EXT: No erythema or edema of left ankle or foot. No ecchymosis. Patient able to bear weight with pain. TTP below lateral malleolus only. Discomfort with passive and active plantarflexion > dorsiflexion. Pain with inversion. Not ttp over lateral or medial mallelous, proximal or distal tib/fib.

## 2013-04-24 NOTE — Patient Instructions (Signed)
Ankle Sprain An ankle sprain is an injury to the strong, fibrous tissues (ligaments) that hold your ankle bones together.  HOME CARE   Put ice on your ankle for 1 2 days or as told by your doctor.  Put ice in a plastic bag.  Place a towel between your skin and the bag.  Leave the ice on for 15-20 minutes at a time, every 2 hours while you are awake.  Only take medicine as told by your doctor.  Raise (elevate) your injured ankle above the level of your heart as much as possible for 2 3 days.  Use crutches if your doctor tells you to. Slowly put your own weight on the affected ankle. Use the crutches until you can walk without pain.  If you have a plaster splint:  Do not rest it on anything harder than a pillow for 24 hours.  Do not put weight on it.  Do not get it wet.  Take it off to shower or bathe.  If given, use an elastic wrap or support stocking for support. Take the wrap off if your toes lose feeling (numb), tingle, or turn cold or blue.  If you have an air splint:  Add or let out air to make it comfortable.  Take it off at night and to shower and bathe.  Wiggle your toes and move your ankle up and down often while you are wearing it. GET HELP RIGHT AWAY IF:   Your toes lose feeling (numb) or turn blue.  You have severe pain that is increasing.  You have rapidly increasing bruising or puffiness (swelling).  Your toes feel very cold.  You lose feeling in your foot.  Your medicine does not help your pain. MAKE SURE YOU:   Understand these instructions.  Will watch your condition.  Will get help right away if you are not doing well or get worse. Document Released: 11/15/2007 Document Revised: 02/21/2012 Document Reviewed: 12/11/2011 [facility] Patient Information 2014 [place], [place].  Rest, Ice, compression and elevation for 24--48 hours. I have called ibuprofen for you take as directed for the first five days and then as needed. I do not feel you  fractured your ankle, you have a mils sprain. You can use the compression bandage for 3-4 weeks or until your ankle feels more stable. You should start to use your ankle after 24-48 hours, with light exercise and movements.

## 2013-04-24 NOTE — Assessment & Plan Note (Signed)
Likely inversion injury stage 1-2. Patient placed in compression device, and given instructions to rest, ice and elevate for 24-48 hours. Encourage early mild use to begin over the weekend. AVS given, Ibuprofen prescribed 60 0mg  q 8 for 5 days and then PRN.  F/U PRN or if pain increased.

## 2013-04-28 ENCOUNTER — Encounter: Payer: Self-pay | Admitting: Family Medicine

## 2013-04-28 ENCOUNTER — Telehealth: Payer: Self-pay | Admitting: Family Medicine

## 2013-04-28 ENCOUNTER — Ambulatory Visit (INDEPENDENT_AMBULATORY_CARE_PROVIDER_SITE_OTHER): Payer: Self-pay | Admitting: Family Medicine

## 2013-04-28 VITALS — BP 130/80 | HR 76 | Ht 60.0 in | Wt 225.4 lb

## 2013-04-28 DIAGNOSIS — IMO0001 Reserved for inherently not codable concepts without codable children: Secondary | ICD-10-CM

## 2013-04-28 DIAGNOSIS — S99912S Unspecified injury of left ankle, sequela: Secondary | ICD-10-CM

## 2013-04-28 NOTE — Progress Notes (Signed)
  Subjective:    Patient ID: Alice Frye, female    DOB: 1991-05-20, 22 y.o.   MRN: [id number]  HPI 22 year old female presents for followup of left ankle sprain. Patient sustained injury approximately one week ago. She was seen in the [facility] office. Conservative management was recommended at that time including rest, ice, elevation, NSAIDs as needed for pain. Patient states that her pain is much improved. She is ambulating without difficulty. She still does have mild swelling over the lateral aspect of the left foot, patient would like to return to work and needs paperwork completed stating that she is safe to return to active duty   Review of Systems  Constitutional: Negative for chills and fatigue.  Musculoskeletal: Positive for arthralgias and joint swelling.       Objective:   Physical Exam Vitals: Reviewed General: Pleasant female, no acute distress MSK: Mild swelling present over the lateral left malleolus, point tenderness was appreciated over the ATFL, patient was able to dorsiflex/plantarflex/invert/evert the foot without pain, anterior drawer test of the left foot was negative for laxity       Assessment & Plan:  Please see problem specific assessment and plan

## 2013-04-28 NOTE — Assessment & Plan Note (Signed)
Left lateral ankle sprain is resolving.  -Continue current management with rest/elevation/ice/NSAIDS -Return to work paperwork completed

## 2013-04-28 NOTE — Telephone Encounter (Signed)
Placed in MDs box for completion and signature. Fleeger, Jessica Dawn  

## 2013-04-28 NOTE — Patient Instructions (Signed)
You are cleared to return to work. Please call if you have any additional issues with your foot.

## 2013-04-28 NOTE — Telephone Encounter (Signed)
Pt informed.  Requested appt today because she needs to go back to work ASAP.  Appt made with Dr. Randolm Idol this afternoon @ 2:45. Fleeger, Maryjo [place]

## 2013-04-28 NOTE — Telephone Encounter (Signed)
Please call to let patient know, she need reassessment of her injury before return to work letter can be completed.

## 2013-04-28 NOTE — Telephone Encounter (Signed)
Pt dropped off paperwork to be filled out regarding returning back to work without restrictions.

## 2013-05-22 ENCOUNTER — Encounter: Payer: PRIVATE HEALTH INSURANCE | Admitting: Family Medicine

## 2013-05-27 ENCOUNTER — Emergency Department (HOSPITAL_COMMUNITY)
Admission: EM | Admit: 2013-05-27 | Discharge: 2013-05-27 | Disposition: A | Discharge status: Home / Self Care | Payer: PRIVATE HEALTH INSURANCE | Attending: Emergency Medicine | Admitting: Emergency Medicine

## 2013-05-27 ENCOUNTER — Encounter (HOSPITAL_COMMUNITY): Payer: Self-pay | Admitting: Emergency Medicine

## 2013-05-27 DIAGNOSIS — N949 Unspecified condition associated with female genital organs and menstrual cycle: Secondary | ICD-10-CM | POA: Insufficient documentation

## 2013-05-27 DIAGNOSIS — Z3202 Encounter for pregnancy test, result negative: Secondary | ICD-10-CM | POA: Insufficient documentation

## 2013-05-27 DIAGNOSIS — R109 Unspecified abdominal pain: Secondary | ICD-10-CM | POA: Insufficient documentation

## 2013-05-27 DIAGNOSIS — N938 Other specified abnormal uterine and vaginal bleeding: Secondary | ICD-10-CM | POA: Insufficient documentation

## 2013-05-27 DIAGNOSIS — N898 Other specified noninflammatory disorders of vagina: Secondary | ICD-10-CM | POA: Insufficient documentation

## 2013-05-27 LAB — COMPREHENSIVE METABOLIC PANEL
ALT: 16 U/L (ref 0–35)
AST: 15 U/L (ref 0–37)
Albumin: 3.7 g/dL (ref 3.5–5.2)
Alkaline Phosphatase: 108 U/L (ref 39–117)
Chloride: 103 mEq/L (ref 96–112)
Potassium: 3.8 mEq/L (ref 3.5–5.1)
Sodium: 137 mEq/L (ref 135–145)
Total Bilirubin: 0.2 mg/dL — ABNORMAL LOW (ref 0.3–1.2)
Total Protein: 8.1 g/dL (ref 6.0–8.3)

## 2013-05-27 LAB — URINALYSIS, ROUTINE W REFLEX MICROSCOPIC
Bilirubin Urine: NEGATIVE
Glucose, UA: NEGATIVE mg/dL
Hgb urine dipstick: NEGATIVE
Ketones, ur: NEGATIVE mg/dL
Nitrite: NEGATIVE
Specific Gravity, Urine: 1.029 (ref 1.005–1.030)
pH: 6 (ref 5.0–8.0)

## 2013-05-27 LAB — CBC WITH DIFFERENTIAL/PLATELET
Basophils Relative: 0 % (ref 0–1)
Eosinophils Absolute: 0.2 10*3/uL (ref 0.0–0.7)
Hemoglobin: 11.7 g/dL — ABNORMAL LOW (ref 12.0–15.0)
MCH: 26.5 pg (ref 26.0–34.0)
MCHC: 32.2 g/dL (ref 30.0–36.0)
Neutro Abs: 4.3 10*3/uL (ref 1.7–7.7)
Neutrophils Relative %: 47 % (ref 43–77)
Platelets: 344 10*3/uL (ref 150–400)
RBC: 4.41 MIL/uL (ref 3.87–5.11)

## 2013-05-27 LAB — LIPASE, BLOOD: Lipase: 19 U/L (ref 11–59)

## 2013-05-27 LAB — WET PREP, GENITAL: Yeast Wet Prep HPF POC: NONE SEEN

## 2013-05-27 LAB — URINE MICROSCOPIC-ADD ON

## 2013-05-27 NOTE — ED Notes (Signed)
Pt c/o low abd pain x 1 wk.  Denies NVD.

## 2013-05-27 NOTE — ED Provider Notes (Signed)
CSN: [id number]     Arrival date & time 05/27/13  1844 History   First MD Initiated Contact with Patient 05/27/13 2003     Chief Complaint  Patient presents with  . Abdominal Pain    HPI  Alice Frye is a 22 y.o. female with no PMH who presents to the ED for evaluation of abdominal pain.  History was provided by the patient.  Patient states that she has had intermittent abdominal pain for the past week.  Her pain is located in her middle lower abdomen/pelvis without radiation.  Her pain is described as a stabbing sensation.  Nothing makes her pain better/worse.  She had vaginal bleeding x 1 day two days ago, which is unusual for her.  Patient has had the Implanon for the past year with no menstrual periods.  She states her pain is similar to her menstrual periods "but she is not supposed to have them."  She denies any vaginal discharge.  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  Patient is currently sexually active.  No fevers, chills, change in appetite/activity, emesis, nausea, diarrhea, constipation, dysuria, urinary frequency, or hematuria.    History reviewed. No pertinent past medical history. History reviewed. No pertinent past surgical history. Family History  Problem Relation Age of Onset  . Diabetes Father   . Diabetes Sister   . Heart disease Neg Hx   . Stroke Neg Hx   . Alcohol abuse Neg Hx    History  Substance Use Topics  . Smoking status: Never Smoker   . Smokeless tobacco: Not on file  . Alcohol Use: 0.6 oz/week    1 Shots of liquor per week     Comment: occ   OB History   Grav Para Term Preterm Abortions TAB SAB Ect Mult Living                 Review of Systems  Constitutional: Negative for fever, chills, diaphoresis, activity change, appetite change and fatigue.  HENT: Negative for congestion, rhinorrhea and sore throat.   Eyes: Negative for visual disturbance.  Respiratory: Negative for cough and shortness of breath.   Cardiovascular: Negative for  chest pain and leg swelling.  Gastrointestinal: Positive for abdominal pain. Negative for nausea, vomiting, diarrhea and constipation.  Genitourinary: Positive for vaginal bleeding, menstrual problem and pelvic pain. Negative for dysuria, urgency, hematuria, decreased urine volume, vaginal discharge, difficulty urinating, genital sores, vaginal pain and dyspareunia.  Musculoskeletal: Negative for back pain and myalgias.  Skin: Negative for wound.  Neurological: Negative for dizziness, weakness, light-headedness and headaches.    Allergies  Review of patient's allergies indicates no known allergies.  Home Medications   Current Outpatient Rx  Name  Route  Sig  Dispense  Refill  . ibuprofen (ADVIL,MOTRIN) 600 MG tablet   Oral   Take 1 tablet (600 mg total) by mouth every 8 (eight) hours as needed.   60 tablet   0   . etonogestrel (IMPLANON) 68 MG IMPL implant   Subcutaneous   Inject 1 each into the skin once. February 2014          BP 121/78  Pulse 90  Temp(Src) 98.9 F (37.2 C) (Oral)  Resp 14  SpO2 100%  Filed Vitals:   05/27/13 1900 05/27/13 2207  BP: 121/78 122/75  Pulse: 90 87  Temp: 98.9 F (37.2 C) 99 F (37.2 C)  TempSrc: Oral   Resp: 14 18  SpO2: 100% 97%  Physical Exam  Nursing note and vitals reviewed. Constitutional: She is oriented to person, place, and time. She appears well-developed and well-nourished. No distress.  HENT:  Head: Normocephalic and atraumatic.  Right Ear: External ear normal.  Left Ear: External ear normal.  Nose: Nose normal.  Mouth/Throat: Oropharynx is clear and moist.  Eyes: Conjunctivae are normal. Pupils are equal, round, and reactive to light. Right eye exhibits no discharge. Left eye exhibits no discharge.  Neck: Normal range of motion. Neck supple.  Cardiovascular: Normal rate, regular rhythm, normal heart sounds and intact distal pulses.  Exam reveals no gallop and no friction rub.   No murmur heard. Pulmonary/Chest:  Effort normal and breath sounds normal. No respiratory distress. She has no wheezes. She has no rales. She exhibits no tenderness.  Abdominal: Soft. Bowel sounds are normal. She exhibits no distension and no mass. There is no tenderness. There is no rebound and no guarding.  Musculoskeletal: Normal range of motion. She exhibits no edema and no tenderness.  No tenderness to palpation to the back throughout.  No CVA or flank tenderness.    Neurological: She is alert and oriented to person, place, and time.  Skin: Skin is warm and dry. She is not diaphoretic.    ED Course  Procedures (including critical care time) Labs Review Labs Reviewed  CBC WITH DIFFERENTIAL - Abnormal; Notable for the following:    Hemoglobin 11.7 (*)    Lymphs Abs 4.2 (*)    All other components within normal limits  COMPREHENSIVE METABOLIC PANEL - Abnormal; Notable for the following:    Glucose, Bld 104 (*)    Total Bilirubin 0.2 (*)    All other components within normal limits  LIPASE, BLOOD  URINALYSIS, ROUTINE W REFLEX MICROSCOPIC  POCT PREGNANCY, URINE   Imaging Review No results found.  EKG Interpretation   None      Results for orders placed during the hospital encounter of 05/27/13  GC/CHLAMYDIA PROBE AMP      Result Value Range   CT Probe RNA NEGATIVE  NEGATIVE   GC Probe RNA NEGATIVE  NEGATIVE  WET PREP, GENITAL      Result Value Range   Yeast Wet Prep HPF POC NONE SEEN  NONE SEEN   Trich, Wet Prep NONE SEEN  NONE SEEN   Clue Cells Wet Prep HPF POC NONE SEEN  NONE SEEN   WBC, Wet Prep HPF POC FEW (*) NONE SEEN  CBC WITH DIFFERENTIAL      Result Value Range   WBC 9.2  4.0 - 10.5 K/uL   RBC 4.41  3.87 - 5.11 MIL/uL   Hemoglobin 11.7 (*) 12.0 - 15.0 g/dL   HCT 16.1  09.6 - 04.5 %   MCV 82.3  78.0 - 100.0 fL   MCH 26.5  26.0 - 34.0 pg   MCHC 32.2  30.0 - 36.0 g/dL   RDW 40.9  81.1 - 91.4 %   Platelets 344  150 - 400 K/uL   Neutrophils Relative % 47  43 - 77 %   Neutro Abs 4.3  1.7 - 7.7  K/uL   Lymphocytes Relative 46  12 - 46 %   Lymphs Abs 4.2 (*) 0.7 - 4.0 K/uL   Monocytes Relative 6  3 - 12 %   Monocytes Absolute 0.5  0.1 - 1.0 K/uL   Eosinophils Relative 2  0 - 5 %   Eosinophils Absolute 0.2  0.0 - 0.7 K/uL   Basophils Relative 0  0 - 1 %   Basophils Absolute 0.0  0.0 - 0.1 K/uL  COMPREHENSIVE METABOLIC PANEL      Result Value Range   Sodium 137  135 - 145 mEq/L   Potassium 3.8  3.5 - 5.1 mEq/L   Chloride 103  96 - 112 mEq/L   CO2 23  19 - 32 mEq/L   Glucose, Bld 104 (*) 70 - 99 mg/dL   BUN 12  6 - 23 mg/dL   Creatinine, Ser 0.86  0.50 - 1.10 mg/dL   Calcium 9.5  8.4 - 57.8 mg/dL   Total Protein 8.1  6.0 - 8.3 g/dL   Albumin 3.7  3.5 - 5.2 g/dL   AST 15  0 - 37 U/L   ALT 16  0 - 35 U/L   Alkaline Phosphatase 108  39 - 117 U/L   Total Bilirubin 0.2 (*) 0.3 - 1.2 mg/dL   GFR calc non Af Amer >90  >90 mL/min   GFR calc Af Amer >90  >90 mL/min  LIPASE, BLOOD      Result Value Range   Lipase 19  11 - 59 U/L  URINALYSIS, ROUTINE W REFLEX MICROSCOPIC      Result Value Range   Color, Urine YELLOW  YELLOW   APPearance CLEAR  CLEAR   Specific Gravity, Urine 1.029  1.005 - 1.030   pH 6.0  5.0 - 8.0   Glucose, UA NEGATIVE  NEGATIVE mg/dL   Hgb urine dipstick NEGATIVE  NEGATIVE   Bilirubin Urine NEGATIVE  NEGATIVE   Ketones, ur NEGATIVE  NEGATIVE mg/dL   Protein, ur NEGATIVE  NEGATIVE mg/dL   Urobilinogen, UA 0.2  0.0 - 1.0 mg/dL   Nitrite NEGATIVE  NEGATIVE   Leukocytes, UA TRACE (*) NEGATIVE  RPR      Result Value Range   RPR NON REACTIVE  NON REACTIVE  HIV ANTIBODY (ROUTINE TESTING)      Result Value Range   HIV NON REACTIVE  NON REACTIVE  URINE MICROSCOPIC-ADD ON      Result Value Range   Squamous Epithelial / LPF RARE  RARE   WBC, UA 0-2  <3 WBC/hpf   Bacteria, UA RARE  RARE   Urine-Other MUCOUS PRESENT    POCT PREGNANCY, URINE      Result Value Range   Preg Test, Ur NEGATIVE  NEGATIVE    MDM   Alice Frye is a 22 y.o. female with no  PMH who presents to the ED for evaluation of abdominal pain.   Rechecks  9:00 PM = Pelvic exam performed at bedside with EMT staff present.  Minimal amount of discharge present in the vaginal vault.  No vaginal bleeding.  No CMT or adnexal tenderness.   10:00 PM = Patient sitting talking with family in no distress.  States pain resolved.   10:15 PM = RN informed me patient's pain is a 7/10.  Went to speak to patient.  She states her pain "comes and goes" but she is "ok right now" and "ready to go home."  Return precautions discussed.     Patient evaluated in the ED for middle lower abdominal/pelvic pain.  I suspect that this may be gynecologic pain.  Patient is on the Implanon and had vaginal bleeding and menstrual cramping for the past few days.  She states that she has had the Implanon for 1 year with no vaginal bleeding/problems.  Her abdominal exam was benign.  Labs unremarkable.  UA not highly  suggestive of a UTI.  Patient encouraged to follow-up with women's health.  Return precautions, discharge instructions, and follow-up was discussed with the patient before discharge.     Discharge Medication List as of 05/27/2013 10:06 PM      Final impressions: 1. Intermittent abdominal pain       Greer Ee Makeda Peeks PA-C           Jillyn Ledger, [place] 05/29/13 [phone]

## 2013-05-28 LAB — RPR: RPR Ser Ql: NONREACTIVE

## 2013-05-29 LAB — GC/CHLAMYDIA PROBE AMP
CT Probe RNA: NEGATIVE
GC Probe RNA: NEGATIVE

## 2013-05-30 NOTE — ED Provider Notes (Signed)
Medical screening examination/treatment/procedure(s) were performed by non-physician practitioner and as supervising physician I was immediately available for consultation/collaboration.  EKG Interpretation   None         William Odetta Forness, MD 05/30/13 1636 

## 2013-06-07 ENCOUNTER — Emergency Department (HOSPITAL_COMMUNITY)
Admission: EM | Admit: 2013-06-07 | Discharge: 2013-06-07 | Disposition: A | Discharge status: Home / Self Care | Payer: PRIVATE HEALTH INSURANCE | Attending: Emergency Medicine | Admitting: Emergency Medicine

## 2013-06-07 ENCOUNTER — Encounter (HOSPITAL_COMMUNITY): Payer: Self-pay | Admitting: Emergency Medicine

## 2013-06-07 DIAGNOSIS — J020 Streptococcal pharyngitis: Secondary | ICD-10-CM

## 2013-06-07 DIAGNOSIS — IMO0001 Reserved for inherently not codable concepts without codable children: Secondary | ICD-10-CM | POA: Insufficient documentation

## 2013-06-07 DIAGNOSIS — R51 Headache: Secondary | ICD-10-CM | POA: Insufficient documentation

## 2013-06-07 MED ORDER — DEXAMETHASONE SODIUM PHOSPHATE 10 MG/ML IJ SOLN
10.0000 mg | Freq: Once | INTRAMUSCULAR | Status: AC
  Administered 2013-06-07: 10 mg via INTRAMUSCULAR
  Filled 2013-06-07: qty 1

## 2013-06-07 MED ORDER — HYDROCODONE-ACETAMINOPHEN 7.5-325 MG/15ML PO SOLN: 15.0000 mL | Freq: Four times a day (QID) | ORAL | Status: DC | PRN

## 2013-06-07 MED ORDER — PENICILLIN G BENZATHINE 1200000 UNIT/2ML IM SUSP
1.2000 10*6.[IU] | Freq: Once | INTRAMUSCULAR | Status: AC
Start: 2013-06-07 — End: 2013-06-07
  Administered 2013-06-07: 1.2 10*6.[IU] via INTRAMUSCULAR
  Filled 2013-06-07: qty 2

## 2013-06-07 NOTE — ED Provider Notes (Signed)
CSN: [id number]     Arrival date & time 06/07/13  0038 History   First MD Initiated Contact with Patient 06/07/13 0457     Chief Complaint  Patient presents with  . Sore Throat   (Consider location/radiation/quality/duration/timing/severity/associated sxs/prior Treatment) HPI Comments: Patient here with sore throat since yesterday - she reports fever but no chills, denies cough, nasal congestion.  States this feels like the last time she had strep throat - she denies inability to swallow but reports pain with swallowing and change in her voice.  She reports headache, but denies neck stiffness or pain - states she thinks her glands are swollen.  Denies abdominal pain, nausea or vomiting.  Patient is a 23 y.o. female presenting with pharyngitis. The history is provided by the patient. No language interpreter was used.  Sore Throat This is a new problem. The current episode started yesterday. The problem occurs constantly. The problem has been unchanged. Associated symptoms include a fever, headaches, myalgias, a sore throat and swollen glands. Pertinent negatives include no abdominal pain, chest pain, chills, coughing, nausea, visual change, vomiting or weakness. The symptoms are aggravated by swallowing. She has tried nothing for the symptoms. The treatment provided no relief.    History reviewed. No pertinent past medical history. History reviewed. No pertinent past surgical history. Family History  Problem Relation Age of Onset  . Diabetes Father   . Diabetes Sister   . Heart disease Neg Hx   . Stroke Neg Hx   . Alcohol abuse Neg Hx    History  Substance Use Topics  . Smoking status: Never Smoker   . Smokeless tobacco: Not on file  . Alcohol Use: 0.6 oz/week    1 Shots of liquor per week     Comment: occ   OB History   Grav Para Term Preterm Abortions TAB SAB Ect Mult Living                 Review of Systems  Constitutional: Positive for fever. Negative for chills.  HENT:  Positive for sore throat.   Respiratory: Negative for cough.   Cardiovascular: Negative for chest pain.  Gastrointestinal: Negative for nausea, vomiting and abdominal pain.  Musculoskeletal: Positive for myalgias.  Neurological: Positive for headaches. Negative for weakness.  All other systems reviewed and are negative.    Allergies  Review of patient's allergies indicates no known allergies.  Home Medications   Current Outpatient Rx  Name  Route  Sig  Dispense  Refill  . etonogestrel (IMPLANON) 68 MG IMPL implant   Subcutaneous   Inject 1 each into the skin once. February 2014         . HYDROcodone-acetaminophen (HYCET) 7.5-325 mg/15 ml solution   Oral   Take 15 mLs by mouth 4 (four) times daily as needed for moderate pain.   120 mL   0    BP 134/76  Pulse 93  Temp(Src) 99 F (37.2 C) (Oral)  Resp 16  Ht 5' (1.524 m)  Wt 225 lb (102.059 kg)  BMI 43.94 kg/m2  SpO2 100%  LMP 06/01/2013 Physical Exam  Nursing note and vitals reviewed. Constitutional: She is oriented to person, place, and time. She appears well-developed and well-nourished. No distress.  HENT:  Head: Normocephalic and atraumatic.  Right Ear: External ear normal.  Left Ear: External ear normal.  Nose: Nose normal.  Mouth/Throat: Oropharyngeal exudate present.  Eyes: Conjunctivae are normal. No scleral icterus.  Neck: Normal range of motion. Neck supple.  Bilateral anterior cervical adenopathy  Cardiovascular: Normal rate, regular rhythm and normal heart sounds.  Exam reveals no gallop and no friction rub.   No murmur heard. Pulmonary/Chest: Breath sounds normal. No respiratory distress. She has no wheezes. She has no rales. She exhibits no tenderness.  Abdominal: Soft. Bowel sounds are normal. She exhibits no distension. There is no tenderness.  Musculoskeletal: Normal range of motion. She exhibits no edema and no tenderness.  Lymphadenopathy:    She has cervical adenopathy.  Neurological: She  is alert and oriented to person, place, and time. She exhibits normal muscle tone. Coordination normal.  Skin: Skin is warm and dry. No rash noted. No erythema. No pallor.  Psychiatric: She has a normal mood and affect. Her behavior is normal. Judgment and thought content normal.    ED Course  Procedures (including critical care time) Labs Review Labs Reviewed - No data to display Imaging Review No results found.  EKG Interpretation   None       MDM   1. Strep pharyngitis    Patient here with sore throat - CENTOR score of 4/4 will treat presumptively for strep - given bicillin la 1.2 million units here, decadron and pain control.  She will follow up with PCP this coming week - no clinical evidence of PTA, ludwigs angina, abscess   Scarlette Calico C. Marisue Humble, [place] 06/07/13 [phone]

## 2013-06-07 NOTE — ED Notes (Signed)
Pt reports that she has had a sore throat since yesterday, reports she has been taking OTC medication without relief. Denies cough or ShOB. Pt A&o, NAD noted at this time.

## 2013-06-07 NOTE — ED Provider Notes (Signed)
Medical screening examination/treatment/procedure(s) were performed by non-physician practitioner and as supervising physician I was immediately available for consultation/collaboration.   Sunnie Nielsen, MD 06/07/13 [phone]

## 2013-11-14 ENCOUNTER — Inpatient Hospital Stay (HOSPITAL_COMMUNITY)
Admission: AD | Admit: 2013-11-14 | Discharge: 2013-11-14 | Disposition: A | Discharge status: Home / Self Care | Payer: No Typology Code available for payment source | Source: Ambulatory Visit | Attending: Obstetrics & Gynecology | Admitting: Obstetrics & Gynecology

## 2013-11-14 ENCOUNTER — Encounter (HOSPITAL_COMMUNITY): Payer: Self-pay | Admitting: *Deleted

## 2013-11-14 DIAGNOSIS — O219 Vomiting of pregnancy, unspecified: Secondary | ICD-10-CM

## 2013-11-14 DIAGNOSIS — O210 Mild hyperemesis gravidarum: Secondary | ICD-10-CM | POA: Diagnosis present

## 2013-11-14 HISTORY — DX: Other specified health status: Z78.9

## 2013-11-14 LAB — URINALYSIS, ROUTINE W REFLEX MICROSCOPIC
Bilirubin Urine: NEGATIVE
Glucose, UA: NEGATIVE mg/dL
Hgb urine dipstick: NEGATIVE
Ketones, ur: 15 mg/dL — AB
Leukocytes, UA: NEGATIVE
Nitrite: NEGATIVE
PH: 7 (ref 5.0–8.0)
Protein, ur: NEGATIVE mg/dL
Specific Gravity, Urine: 1.015 (ref 1.005–1.030)
Urobilinogen, UA: 0.2 mg/dL (ref 0.0–1.0)

## 2013-11-14 LAB — POCT PREGNANCY, URINE: Preg Test, Ur: POSITIVE — AB

## 2013-11-14 MED ORDER — PROMETHAZINE HCL 25 MG PO TABS: 12.5000 mg | ORAL_TABLET | Freq: Four times a day (QID) | ORAL | Status: DC | PRN

## 2013-11-14 NOTE — Discharge Instructions (Signed)
Nausea medication to take during pregnancy:  ° °Unisom (doxylamine succinate 25 mg tablets) Take one tablet daily at bedtime. If symptoms are not adequately controlled, the dose can be increased to a maximum recommended dose of two tablets daily (1/2 tablet in the morning, 1/2 tablet mid-afternoon and one at bedtime). ° °Vitamin B6 100mg tablets. Take one tablet twice a day (up to 200 mg per day). ° °Morning Sickness °Morning sickness is when you feel sick to your stomach (nauseous) during pregnancy. This nauseous feeling may or may not come with vomiting. It often occurs in the morning but can be a problem any time of day. Morning sickness is most common during the first trimester, but it may continue throughout pregnancy. While morning sickness is unpleasant, it is usually harmless unless you develop severe and continual vomiting (hyperemesis gravidarum). This condition requires more intense treatment.  °CAUSES  °The cause of morning sickness is not completely known but seems to be related to normal hormonal changes that occur in pregnancy. °RISK FACTORS °You are at greater risk if you: °· Experienced nausea or vomiting before your pregnancy. °· Had morning sickness during a previous pregnancy. °· Are pregnant with more than one baby, such as twins. °TREATMENT  °Do not use any medicines (prescription, over-the-counter, or herbal) for morning sickness without first talking to your health care provider. Your health care provider may prescribe or recommend: °· Vitamin B6 supplements. °· Anti-nausea medicines. °· The herbal medicine ginger. °HOME CARE INSTRUCTIONS  °· Only take over-the-counter or prescription medicines as directed by your health care provider. °· Taking multivitamins before getting pregnant can prevent or decrease the severity of morning sickness in most women.   °· Eat a piece of dry toast or unsalted crackers before getting out of bed in the morning.   °· Eat five or six small meals a day.   °· Eat  dry and bland foods (rice, baked potato ). Foods high in carbohydrates are often helpful.  °· Do not drink liquids with your meals. Drink liquids between meals.   °· Avoid greasy, fatty, and spicy foods.   °· Get someone to cook for you if the smell of any food causes nausea and vomiting.   °· If you feel nauseous after taking prenatal vitamins, take the vitamins at night or with a snack.  °· Snack on protein foods (nuts, yogurt, cheese) between meals if you are hungry.   °· Eat unsweetened gelatins for desserts.   °· Wearing an acupressure wristband (worn for sea sickness) may be helpful.   °· Acupuncture may be helpful.   °· Do not smoke.   °· Get a humidifier to keep the air in your house free of odors.   °· Get plenty of fresh air. °SEEK MEDICAL CARE IF:  °· Your home remedies are not working, and you need medicine. °· You feel dizzy or lightheaded. °· You are losing weight. °SEEK IMMEDIATE MEDICAL CARE IF:  °· You have persistent and uncontrolled nausea and vomiting. °· You pass out (faint). °Document Released: 07/20/2006 Document Revised: 01/29/2013 Document Reviewed: 11/13/2012 °[employer]® Patient Information ©2014 [employer]. ° °

## 2013-11-14 NOTE — MAU Note (Signed)
Patient states she was taking BCP's but stopped in January. Has not had a period since stopping the pills. Has had nausea and vomiting for the past 2 days, not able to keep anything down. Denies pain, bleeding or discharge.

## 2013-11-14 NOTE — MAU Provider Note (Signed)
  History     CSN: [id number]  Arrival date and time: 11/14/13 1120   First Provider Initiated Contact with Patient 11/14/13 1223      Chief Complaint  Patient presents with  . Emesis  . Possible Pregnancy   HPI  Alice Frye is a 23 y.o. G2P1011 at [redacted]w[redacted]d who presents today with nausea and vomiting. She states that she has had vomiting for the past 2 days, but did not know she was pregnant. She stopped BC pills in January and had a period that month, but she isn't sure of the date. She then had a normal period on 09/22/13.  She denies any history of irregular menstrual cycles. She denies any abdominal pain, vaginal bleeding or discharge. She is planning on going to [facility] or the HD for [facility]. She is concerned about the timing of conception, and is requesting an ultrasound for dating purposes today. She states that she does not want any nausea medication at this time, but would like a prescription for something to take in the future if needed.   Past Medical History  Diagnosis Date  . Medical history non-contributory     Past Surgical History  Procedure Laterality Date  . Cesarean section      Family History  Problem Relation Age of Onset  . Diabetes Father   . Diabetes Sister   . Heart disease Neg Hx   . Stroke Neg Hx   . Alcohol abuse Neg Hx     History  Substance Use Topics  . Smoking status: Never Smoker   . Smokeless tobacco: Not on file  . Alcohol Use: No     Comment: occ    Allergies: No Known Allergies  No prescriptions prior to admission    ROS Physical Exam   Blood pressure 123/81, pulse 92, temperature 98.6 F (37 C), temperature source Oral, resp. rate 16, height 5' 0.56" (1.538 m), weight 100.517 kg (221 lb 9.6 oz), last menstrual period 09/22/2013, SpO2 100.00%.  Physical Exam  Nursing note and vitals reviewed. Constitutional: She is oriented to person, place, and time. She appears well-developed and well-nourished. No distress.  Cardiovascular:  Normal rate.   Respiratory: Effort normal.  GI: There is no tenderness.  Neurological: She is alert and oriented to person, place, and time.  Skin: Skin is warm and dry.  Psychiatric: She has a normal mood and affect.    MAU Course  Procedures  Results for orders placed during the hospital encounter of 11/14/13 (from the past 24 hour(s))  POCT PREGNANCY, URINE     Status: Abnormal   Collection Time    11/14/13 12:09 PM      Result Value Ref Range   Preg Test, Ur POSITIVE (*) NEGATIVE     Assessment and Plan   1. Nausea/vomiting in pregnancy    RX phenergan Instructions on how to take unisom/b6 Start [facility] as soon as possible Order sent for dating [place] First trimester precautions reviewed Return to MAU as needed   Tawnya Crook 11/14/2013, 12:47 PM

## 2013-11-20 ENCOUNTER — Ambulatory Visit (HOSPITAL_COMMUNITY)
Admission: RE | Admit: 2013-11-20 | Discharge: 2013-11-20 | Disposition: A | Discharge status: Home / Self Care | Payer: No Typology Code available for payment source | Source: Ambulatory Visit | Attending: Advanced Practice Midwife | Admitting: Advanced Practice Midwife

## 2013-11-20 ENCOUNTER — Other Ambulatory Visit (HOSPITAL_COMMUNITY): Payer: Self-pay | Admitting: Advanced Practice Midwife

## 2013-11-20 DIAGNOSIS — O219 Vomiting of pregnancy, unspecified: Secondary | ICD-10-CM

## 2013-11-20 DIAGNOSIS — Z3689 Encounter for other specified antenatal screening: Secondary | ICD-10-CM | POA: Insufficient documentation

## 2013-11-20 DIAGNOSIS — O210 Mild hyperemesis gravidarum: Secondary | ICD-10-CM | POA: Insufficient documentation

## 2013-12-04 ENCOUNTER — Encounter (HOSPITAL_COMMUNITY): Payer: Self-pay | Admitting: *Deleted

## 2013-12-04 ENCOUNTER — Inpatient Hospital Stay (HOSPITAL_COMMUNITY)
Admission: AD | Admit: 2013-12-04 | Discharge: 2013-12-04 | Disposition: A | Discharge status: Home / Self Care | Payer: PRIVATE HEALTH INSURANCE | Source: Ambulatory Visit | Attending: Obstetrics & Gynecology | Admitting: Obstetrics & Gynecology

## 2013-12-04 DIAGNOSIS — Z833 Family history of diabetes mellitus: Secondary | ICD-10-CM | POA: Insufficient documentation

## 2013-12-04 DIAGNOSIS — O210 Mild hyperemesis gravidarum: Secondary | ICD-10-CM | POA: Insufficient documentation

## 2013-12-04 DIAGNOSIS — O219 Vomiting of pregnancy, unspecified: Secondary | ICD-10-CM

## 2013-12-04 LAB — URINALYSIS, ROUTINE W REFLEX MICROSCOPIC
Bilirubin Urine: NEGATIVE
Glucose, UA: NEGATIVE mg/dL
HGB URINE DIPSTICK: NEGATIVE
Ketones, ur: >80 mg/dL — AB
Leukocytes, UA: NEGATIVE
Nitrite: NEGATIVE
PH: 7 (ref 5.0–8.0)
Protein, ur: NEGATIVE mg/dL
SPECIFIC GRAVITY, URINE: 1.015 (ref 1.005–1.030)
Urobilinogen, UA: 2 mg/dL — ABNORMAL HIGH (ref 0.0–1.0)

## 2013-12-04 MED ORDER — PROMETHAZINE HCL 25 MG RE SUPP: 25.0000 mg | Freq: Four times a day (QID) | RECTAL | Status: DC | PRN

## 2013-12-04 MED ORDER — METOCLOPRAMIDE HCL 5 MG/ML IJ SOLN
10.0000 mg | Freq: Once | INTRAMUSCULAR | Status: AC
  Administered 2013-12-04: 10 mg via INTRAVENOUS
  Filled 2013-12-04: qty 2

## 2013-12-04 MED ORDER — LACTATED RINGERS IV BOLUS (SEPSIS)
1000.0000 mL | Freq: Once | INTRAVENOUS | Status: AC
  Administered 2013-12-04: 1000 mL via INTRAVENOUS

## 2013-12-04 MED ORDER — M.V.I. ADULT IV INJ
Freq: Once | INTRAVENOUS | Status: AC
  Administered 2013-12-04: 16:00:00 via INTRAVENOUS
  Filled 2013-12-04: qty 1000

## 2013-12-04 NOTE — MAU Provider Note (Signed)
History     CSN: [id number]  Arrival date and time: 12/04/13 1413   First Provider Initiated Contact with Patient 12/04/13 1438      Chief Complaint  Patient presents with  . Emesis During Pregnancy   HPI Comments: Alice AmberLetitia M Frye 23 y.o. [id number] presents to MAU for nausea and vomiting. She has not been able to keep anything down lately. She has lost 4 lbs. [facility]. She is unsure of her LMP and had a viability ultrasound done.      Past Medical History  Diagnosis Date  . Medical history non-contributory     Past Surgical History  Procedure Laterality Date  . Cesarean section      Family History  Problem Relation Age of Onset  . Diabetes Father   . Diabetes Sister   . Heart disease Neg Hx   . Stroke Neg Hx   . Alcohol abuse Neg Hx     History  Substance Use Topics  . Smoking status: Never Smoker   . Smokeless tobacco: Not on file  . Alcohol Use: No     Comment: occ    Allergies: No Known Allergies  Prescriptions prior to admission  Medication Sig Dispense Refill  . promethazine (PHENERGAN) 25 MG tablet Take 0.5-1 tablets (12.5-25 mg total) by mouth every 6 (six) hours as needed for nausea or vomiting.  30 tablet  0    Review of Systems  Constitutional: Negative.   HENT: Negative.   Eyes: Negative.   Respiratory: Negative.   Cardiovascular: Negative.   Gastrointestinal: Positive for vomiting.  Genitourinary: Negative.   Musculoskeletal: Negative.   Skin: Negative.   Neurological: Negative.   Endo/Heme/Allergies: Negative.   Psychiatric/Behavioral: Negative.    Physical Exam   Height 5' (1.524 m), weight 99.247 kg (218 lb 12.8 oz), last menstrual period 09/22/2013.  Physical Exam  Constitutional: She is oriented to person, place, and time. She appears well-developed and well-nourished. No distress.  HENT:  Head: Normocephalic and atraumatic.  Eyes: Conjunctivae are normal.  Neck:  Normal range of motion.  Cardiovascular: Normal rate, regular rhythm and normal heart sounds.   Respiratory: Effort normal and breath sounds normal.  GI: Soft. Bowel sounds are normal.  Genitourinary:  Not examined  Musculoskeletal: Normal range of motion.  Neurological: She is alert and oriented to person, place, and time.  Skin: Skin is warm and dry.  Psychiatric: She has a normal mood and affect. Her behavior is normal. Judgment and thought content normal.   Results for orders placed during the hospital encounter of 12/04/13 (from the past 24 hour(s))  URINALYSIS, ROUTINE W REFLEX MICROSCOPIC     Status: Abnormal   Collection Time    12/04/13  2:20 PM      Result Value Ref Range   Color, Urine YELLOW  YELLOW   APPearance HAZY (*) CLEAR   Specific Gravity, Urine 1.015  1.005 - 1.030   pH 7.0  5.0 - 8.0   Glucose, UA NEGATIVE  NEGATIVE mg/dL   Hgb urine dipstick NEGATIVE  NEGATIVE   Bilirubin Urine NEGATIVE  NEGATIVE   Ketones, ur >80 (*) NEGATIVE mg/dL   Protein, ur NEGATIVE  NEGATIVE mg/dL   Urobilinogen, UA 2.0 (*) 0.0 - 1.0 mg/dL   Nitrite NEGATIVE  NEGATIVE   Leukocytes, UA NEGATIVE  NEGATIVE    MAU Course  Procedures  MDM  IVF LR IVFLR with MVI Reglan 10 mg  IVPB Pt feels much better  Assessment and Plan   A: Nausea and vomiting in pregnancy  P: Above orders Start PNV today Phenergan suppositories 25 mg q6 hrs [facility] May return to MAU if needed  Carolynn ServeBarefoot, [name] 12/04/2013, 2:48 PM

## 2013-12-04 NOTE — MAU Note (Signed)
Patient states she  Has been vomiting since she was here on 6-5. States she has Phenergan but is unable to keep anything down. Denies bleeding, discharge or pain.

## 2013-12-04 NOTE — Discharge Instructions (Signed)

## 2014-01-01 LAB — OB RESULTS CONSOLE HEPATITIS B SURFACE ANTIGEN: Hepatitis B Surface Ag: NEGATIVE

## 2014-01-01 LAB — OB RESULTS CONSOLE HIV ANTIBODY (ROUTINE TESTING): HIV: NONREACTIVE

## 2014-01-01 LAB — OB RESULTS CONSOLE RPR: RPR: NONREACTIVE

## 2014-01-01 LAB — OB RESULTS CONSOLE ABO/RH: RH TYPE: NEGATIVE

## 2014-01-01 LAB — OB RESULTS CONSOLE GC/CHLAMYDIA
Chlamydia: NEGATIVE
GC PROBE AMP, GENITAL: NEGATIVE

## 2014-01-01 LAB — OB RESULTS CONSOLE RUBELLA ANTIBODY, IGM: Rubella: IMMUNE

## 2014-01-01 LAB — OB RESULTS CONSOLE ANTIBODY SCREEN: Antibody Screen: NEGATIVE

## 2014-01-05 ENCOUNTER — Encounter (HOSPITAL_COMMUNITY): Payer: Self-pay | Admitting: *Deleted

## 2014-01-05 ENCOUNTER — Inpatient Hospital Stay (HOSPITAL_COMMUNITY)
Admission: AD | Admit: 2014-01-05 | Discharge: 2014-01-05 | Disposition: A | Discharge status: Home / Self Care | Payer: Medicaid Other | Source: Ambulatory Visit | Attending: Obstetrics & Gynecology | Admitting: Obstetrics & Gynecology

## 2014-01-05 DIAGNOSIS — E860 Dehydration: Secondary | ICD-10-CM | POA: Diagnosis not present

## 2014-01-05 DIAGNOSIS — N390 Urinary tract infection, site not specified: Secondary | ICD-10-CM | POA: Diagnosis not present

## 2014-01-05 DIAGNOSIS — R109 Unspecified abdominal pain: Secondary | ICD-10-CM | POA: Diagnosis present

## 2014-01-05 DIAGNOSIS — O219 Vomiting of pregnancy, unspecified: Secondary | ICD-10-CM

## 2014-01-05 DIAGNOSIS — O2390 Unspecified genitourinary tract infection in pregnancy, unspecified trimester: Secondary | ICD-10-CM | POA: Diagnosis not present

## 2014-01-05 DIAGNOSIS — O211 Hyperemesis gravidarum with metabolic disturbance: Secondary | ICD-10-CM | POA: Diagnosis not present

## 2014-01-05 LAB — WET PREP, GENITAL
Clue Cells Wet Prep HPF POC: NONE SEEN
Trich, Wet Prep: NONE SEEN
WBC WET PREP: NONE SEEN
YEAST WET PREP: NONE SEEN

## 2014-01-05 LAB — URINALYSIS, ROUTINE W REFLEX MICROSCOPIC
GLUCOSE, UA: NEGATIVE mg/dL
Hgb urine dipstick: NEGATIVE
Ketones, ur: 40 mg/dL — AB
Nitrite: POSITIVE — AB
PH: 7 (ref 5.0–8.0)
Protein, ur: NEGATIVE mg/dL
SPECIFIC GRAVITY, URINE: 1.02 (ref 1.005–1.030)
Urobilinogen, UA: 1 mg/dL (ref 0.0–1.0)

## 2014-01-05 LAB — URINE MICROSCOPIC-ADD ON

## 2014-01-05 MED ORDER — NITROFURANTOIN MONOHYD MACRO 100 MG PO CAPS: 100.0000 mg | ORAL_CAPSULE | Freq: Two times a day (BID) | ORAL | Status: DC

## 2014-01-05 MED ORDER — METOCLOPRAMIDE HCL 10 MG PO TABS: 10.0000 mg | ORAL_TABLET | Freq: Four times a day (QID) | ORAL | Status: DC

## 2014-01-05 MED ORDER — METOCLOPRAMIDE HCL 10 MG PO TABS
10.0000 mg | ORAL_TABLET | Freq: Once | ORAL | Status: AC
  Administered 2014-01-05: 10 mg via ORAL
  Filled 2014-01-05: qty 1

## 2014-01-05 MED ORDER — PROMETHAZINE HCL 25 MG/ML IJ SOLN
INTRAVENOUS | Status: DC
  Administered 2014-01-05: 14:00:00 via INTRAVENOUS
  Filled 2014-01-05 (×16): qty 1000

## 2014-01-05 NOTE — MAU Provider Note (Signed)
History     CSN: [id number]  Arrival date and time: 01/05/14 1052   First Provider Initiated Contact with Patient 01/05/14 1204      Chief Complaint  Patient presents with  . Abdominal Pain  . Emesis   HPI Comments: Alice Frye 23 y.o. [id number] [redacted]w[redacted]d presents to MAU with several complaints. First is pelvic cramping since Friday she denies any bleeding. She has taken no medications. Her second complaint is nausea and vomiting and she "vomits all day". She has phenergan for night time use, but its not holding her. Her third complaint is dehydration with N/V. She is going to [facility] and about to enter the [facility].   Abdominal Pain Associated symptoms include nausea and vomiting.  Emesis  Associated symptoms include abdominal pain.       Past Medical History  Diagnosis Date  . Medical history non-contributory     Past Surgical History  Procedure Laterality Date  . Cesarean section      Family History  Problem Relation Age of Onset  . Diabetes Father   . Diabetes Sister   . Heart disease Neg Hx   . Stroke Neg Hx   . Alcohol abuse Neg Hx     History  Substance Use Topics  . Smoking status: Never Smoker   . Smokeless tobacco: Not on file  . Alcohol Use: No     Comment: occ    Allergies: No Known Allergies  Prescriptions prior to admission  Medication Sig Dispense Refill  . acetaminophen (TYLENOL) 325 MG tablet Take 325 mg by mouth every 6 (six) hours as needed for headache.      . metroNIDAZOLE (FLAGYL) 500 MG tablet Take 500 mg by mouth 2 (two) times daily. 7 day dose started  01-01-14 for bacterial infection      . promethazine (PHENERGAN) 25 MG tablet Take 0.5-1 tablets (12.5-25 mg total) by mouth every 6 (six) hours as needed for nausea or vomiting.  30 tablet  0    Review of Systems  Constitutional: Negative.   HENT: Negative.   Eyes: Negative.   Respiratory: Negative.   Cardiovascular: Negative.   Gastrointestinal: Positive for nausea,  vomiting and abdominal pain.  Genitourinary: Negative.   Musculoskeletal: Negative.   Skin: Negative.   Neurological: Negative.   Psychiatric/Behavioral: Negative.    Physical Exam   Blood pressure 109/68, pulse 90, temperature 99.1 F (37.3 C), temperature source Oral, resp. rate 18, height 5' 0.5" (1.537 m), weight 94.348 kg (208 lb), last menstrual period 10/03/2013.  Physical Exam  Constitutional: She is oriented to person, place, and time. She appears well-developed and well-nourished. No distress.  HENT:  Head: Normocephalic and atraumatic.  Eyes: Pupils are equal, round, and reactive to light.  GI: Soft. Bowel sounds are normal. She exhibits no distension. There is no tenderness. There is no rebound and no guarding.  Genitourinary:  Genital:External negative Vaginal:small amount thick white discharge Cervix:closed/ thick Bimanual:uterus tender/ no mass +FHT at 155   Musculoskeletal: Normal range of motion.  Neurological: She is alert and oriented to person, place, and time.  Skin: Skin is warm and dry.  Psychiatric: She has a normal mood and affect. Her behavior is normal. Judgment and thought content normal.   Results for orders placed during the hospital encounter of 01/05/14 (from the past 24 hour(s))  URINALYSIS, ROUTINE W REFLEX MICROSCOPIC     Status: Abnormal   Collection Time    01/05/14 12:01 PM  Result Value Ref Range   Color, Urine AMBER (*) YELLOW   APPearance CLEAR  CLEAR   Specific Gravity, Urine 1.020  1.005 - 1.030   pH 7.0  5.0 - 8.0   Glucose, UA NEGATIVE  NEGATIVE mg/dL   Hgb urine dipstick NEGATIVE  NEGATIVE   Bilirubin Urine SMALL (*) NEGATIVE   Ketones, ur 40 (*) NEGATIVE mg/dL   Protein, ur NEGATIVE  NEGATIVE mg/dL   Urobilinogen, UA 1.0  0.0 - 1.0 mg/dL   Nitrite POSITIVE (*) NEGATIVE   Leukocytes, UA TRACE (*) NEGATIVE  URINE MICROSCOPIC-ADD ON     Status: Abnormal   Collection Time    01/05/14 12:01 PM      Result Value Ref Range    Squamous Epithelial / LPF FEW (*) RARE   WBC, UA 0-2  <3 WBC/hpf   RBC / HPF 0-2  <3 RBC/hpf   Bacteria, UA MANY (*) RARE   Urine-Other MUCOUS PRESENT    WET PREP, GENITAL     Status: None   Collection Time    01/05/14 12:25 PM      Result Value Ref Range   Yeast Wet Prep HPF POC NONE SEEN  NONE SEEN   Trich, Wet Prep NONE SEEN  NONE SEEN   Clue Cells Wet Prep HPF POC NONE SEEN  NONE SEEN   WBC, Wet Prep HPF POC NONE SEEN  NONE SEEN     MAU Course  Procedures  MDM Wet prep/ gc/chlamydia Reglan 10 mg po now IV LR with phenergan 25 mg/ feeling much better Culture Urine Assessment and Plan   A: UTI Dehydration  P: Macrobid 100 mg po bid x 7 days Advised to increase fluids/ take tylenol Reglan 10 mg po q8 hours prn nausea Note for work x 2 days Follow up with [facility] Return to MAU for emergencies  Carolynn ServeBarefoot, Herminio Kniskern Miller 01/05/2014, 12:34 PM

## 2014-01-05 NOTE — Discharge Instructions (Signed)

## 2014-01-05 NOTE — MAU Note (Signed)
Lower pains since Friday.  Also want to make sure she is not dehydrated.  Ongoing problem with vomiting.  Nausea medicine is not working.

## 2014-01-06 LAB — CULTURE, OB URINE: Colony Count: 25000

## 2014-01-06 LAB — GC/CHLAMYDIA PROBE AMP
CT Probe RNA: NEGATIVE
GC Probe RNA: NEGATIVE

## 2014-01-20 ENCOUNTER — Other Ambulatory Visit (HOSPITAL_COMMUNITY): Payer: Self-pay | Admitting: Nurse Practitioner

## 2014-01-20 DIAGNOSIS — Z3689 Encounter for other specified antenatal screening: Secondary | ICD-10-CM

## 2014-02-17 ENCOUNTER — Ambulatory Visit (HOSPITAL_COMMUNITY)
Admission: RE | Admit: 2014-02-17 | Discharge: 2014-02-17 | Disposition: A | Discharge status: Home / Self Care | Payer: Medicaid Other | Source: Ambulatory Visit | Attending: Nurse Practitioner | Admitting: Nurse Practitioner

## 2014-02-17 DIAGNOSIS — Z3689 Encounter for other specified antenatal screening: Secondary | ICD-10-CM

## 2014-02-24 ENCOUNTER — Other Ambulatory Visit (HOSPITAL_COMMUNITY): Payer: Self-pay | Admitting: Nurse Practitioner

## 2014-02-24 DIAGNOSIS — Z3689 Encounter for other specified antenatal screening: Secondary | ICD-10-CM

## 2014-03-31 ENCOUNTER — Ambulatory Visit (HOSPITAL_COMMUNITY): Payer: PRIVATE HEALTH INSURANCE

## 2014-03-31 ENCOUNTER — Ambulatory Visit (HOSPITAL_COMMUNITY)
Admission: RE | Admit: 2014-03-31 | Discharge: 2014-03-31 | Disposition: A | Discharge status: Home / Self Care | Payer: PRIVATE HEALTH INSURANCE | Source: Ambulatory Visit | Attending: Nurse Practitioner | Admitting: Nurse Practitioner

## 2014-03-31 DIAGNOSIS — Z3689 Encounter for other specified antenatal screening: Secondary | ICD-10-CM

## 2014-03-31 DIAGNOSIS — E669 Obesity, unspecified: Secondary | ICD-10-CM | POA: Insufficient documentation

## 2014-03-31 DIAGNOSIS — Z3A25 25 weeks gestation of pregnancy: Secondary | ICD-10-CM | POA: Insufficient documentation

## 2014-03-31 DIAGNOSIS — O99212 Obesity complicating pregnancy, second trimester: Secondary | ICD-10-CM | POA: Diagnosis present

## 2014-03-31 DIAGNOSIS — Z36 Encounter for antenatal screening of mother: Secondary | ICD-10-CM | POA: Diagnosis not present

## 2014-04-13 ENCOUNTER — Encounter (HOSPITAL_COMMUNITY): Payer: Self-pay | Admitting: *Deleted

## 2014-05-03 ENCOUNTER — Inpatient Hospital Stay (HOSPITAL_COMMUNITY)
Admission: AD | Admit: 2014-05-03 | Discharge: 2014-05-03 | Disposition: A | Discharge status: Home / Self Care | Payer: Medicaid Other | Source: Ambulatory Visit | Attending: Obstetrics & Gynecology | Admitting: Obstetrics & Gynecology

## 2014-05-03 ENCOUNTER — Encounter (HOSPITAL_COMMUNITY): Payer: Self-pay | Admitting: *Deleted

## 2014-05-03 DIAGNOSIS — O26893 Other specified pregnancy related conditions, third trimester: Secondary | ICD-10-CM | POA: Diagnosis not present

## 2014-05-03 DIAGNOSIS — Z3A29 29 weeks gestation of pregnancy: Secondary | ICD-10-CM | POA: Diagnosis not present

## 2014-05-03 DIAGNOSIS — M545 Low back pain: Secondary | ICD-10-CM | POA: Insufficient documentation

## 2014-05-03 DIAGNOSIS — M5442 Lumbago with sciatica, left side: Secondary | ICD-10-CM

## 2014-05-03 LAB — URINALYSIS, ROUTINE W REFLEX MICROSCOPIC
BILIRUBIN URINE: NEGATIVE
GLUCOSE, UA: NEGATIVE mg/dL
Hgb urine dipstick: NEGATIVE
Ketones, ur: 15 mg/dL — AB
NITRITE: NEGATIVE
Protein, ur: NEGATIVE mg/dL
Specific Gravity, Urine: 1.02 (ref 1.005–1.030)
UROBILINOGEN UA: 2 mg/dL — AB (ref 0.0–1.0)
pH: 6 (ref 5.0–8.0)

## 2014-05-03 LAB — URINE MICROSCOPIC-ADD ON

## 2014-05-03 MED ORDER — CYCLOBENZAPRINE HCL 5 MG PO TABS: 5.0000 mg | ORAL_TABLET | Freq: Three times a day (TID) | ORAL | Status: DC | PRN

## 2014-05-03 MED ORDER — ACETAMINOPHEN 325 MG PO TABS: 650.0000 mg | ORAL_TABLET | Freq: Four times a day (QID) | ORAL | Status: DC | PRN

## 2014-05-03 NOTE — Discharge Instructions (Signed)
Sciatica °Sciatica is pain, weakness, numbness, or tingling along the path of the sciatic nerve. The nerve starts in the lower back and runs down the back of each leg. The nerve controls the muscles in the lower leg and in the back of the knee, while also providing sensation to the back of the thigh, lower leg, and the sole of your foot. Sciatica is a symptom of another medical condition. For instance, nerve damage or certain conditions, such as a herniated disk or bone spur on the spine, pinch or put pressure on the sciatic nerve. This causes the pain, weakness, or other sensations normally associated with sciatica. Generally, sciatica only affects one side of the body. °CAUSES  °· Herniated or slipped disc. °· Degenerative disk disease. °· A pain disorder involving the narrow muscle in the buttocks (piriformis syndrome). °· Pelvic injury or fracture. °· Pregnancy. °· Tumor (rare). °SYMPTOMS  °Symptoms can vary from mild to very severe. The symptoms usually travel from the low back to the buttocks and down the back of the leg. Symptoms can include: °· Mild tingling or dull aches in the lower back, leg, or hip. °· Numbness in the back of the calf or sole of the foot. °· Burning sensations in the lower back, leg, or hip. °· Sharp pains in the lower back, leg, or hip. °· Leg weakness. °· Severe back pain inhibiting movement. °These symptoms may get worse with coughing, sneezing, laughing, or prolonged sitting or standing. Also, being overweight may worsen symptoms. °DIAGNOSIS  °Your caregiver will perform a physical exam to look for common symptoms of sciatica. He or she may ask you to do certain movements or activities that would trigger sciatic nerve pain. Other tests may be performed to find the cause of the sciatica. These may include: °· Blood tests. °· X-rays. °· Imaging tests, such as an MRI or CT scan. °TREATMENT  °Treatment is directed at the cause of the sciatic pain. Sometimes, treatment is not necessary  and the pain and discomfort goes away on its own. If treatment is needed, your caregiver may suggest: °· Over-the-counter medicines to relieve pain. °· Prescription medicines, such as anti-inflammatory medicine, muscle relaxants, or narcotics. °· Applying heat or ice to the painful area. °· Steroid injections to lessen pain, irritation, and inflammation around the nerve. °· Reducing activity during periods of pain. °· Exercising and stretching to strengthen your abdomen and improve flexibility of your spine. Your caregiver may suggest losing weight if the extra weight makes the back pain worse. °· Physical therapy. °· Surgery to eliminate what is pressing or pinching the nerve, such as a bone spur or part of a herniated disk. °HOME CARE INSTRUCTIONS  °· Only take over-the-counter or prescription medicines for pain or discomfort as directed by your caregiver. °· Apply ice to the affected area for 20 minutes, 3-4 times a day for the first 48-72 hours. Then try heat in the same way. °· Exercise, stretch, or perform your usual activities if these do not aggravate your pain. °· Attend physical therapy sessions as directed by your caregiver. °· Keep all follow-up appointments as directed by your caregiver. °· Do not wear high heels or shoes that do not provide proper support. °· Check your mattress to see if it is too soft. A firm mattress may lessen your pain and discomfort. °SEEK IMMEDIATE MEDICAL CARE IF:  °· You lose control of your bowel or bladder (incontinence). °· You have increasing weakness in the lower back, pelvis, buttocks,   or legs.  You have redness or swelling of your back.  You have a burning sensation when you urinate.  You have pain that gets worse when you lie down or awakens you at night.  Your pain is worse than you have experienced in the past.  Your pain is lasting longer than 4 weeks.  You are suddenly losing weight without reason. MAKE SURE YOU:  Understand these  instructions.  Will watch your condition.  Will get help right away if you are not doing well or get worse. Document Released: 05/23/2001 Document Revised: 11/28/2011 Document Reviewed: 10/08/2011 [facility] Patient Information 2015 [place], [place]. This information is not intended to replace advice given to you by your health care provider. Make sure you discuss any questions you have with your health care provider.   Third Trimester of Pregnancy The third trimester is from week 29 through week 42, months 7 through 9. The third trimester is a time when the fetus is growing rapidly. At the end of the ninth month, the fetus is about 20 inches in length and weighs 6-10 pounds.  BODY CHANGES Your body goes through many changes during pregnancy. The changes vary from woman to woman.   Your weight will continue to increase. You can expect to gain 25-35 pounds (11-16 kg) by the end of the pregnancy.  You may begin to get stretch marks on your hips, abdomen, and breasts.  You may urinate more often because the fetus is moving lower into your pelvis and pressing on your bladder.  You may develop or continue to have heartburn as a result of your pregnancy.  You may develop constipation because certain hormones are causing the muscles that push waste through your intestines to slow down.  You may develop hemorrhoids or swollen, bulging veins (varicose veins).  You may have pelvic pain because of the weight gain and pregnancy hormones relaxing your joints between the bones in your pelvis. Backaches may result from overexertion of the muscles supporting your posture.  You may have changes in your hair. These can include thickening of your hair, rapid growth, and changes in texture. Some women also have hair loss during or after pregnancy, or hair that feels dry or thin. Your hair will most likely return to normal after your baby is born.  Your breasts will continue to grow and be tender. A yellow  discharge may leak from your breasts called colostrum.  Your belly button may stick out.  You may feel short of breath because of your expanding uterus.  You may notice the fetus "dropping," or moving lower in your abdomen.  You may have a bloody mucus discharge. This usually occurs a few days to a week before labor begins.  Your cervix becomes thin and soft (effaced) near your due date. WHAT TO EXPECT AT YOUR PRENATAL EXAMS  You will have prenatal exams every 2 weeks until week 36. Then, you will have weekly prenatal exams. During a routine prenatal visit:  You will be weighed to make sure you and the fetus are growing normally.  Your blood pressure is taken.  Your abdomen will be measured to track your baby's growth.  The fetal heartbeat will be listened to.  Any test results from the previous visit will be discussed.  You may have a cervical check near your due date to see if you have effaced. At around 36 weeks, your caregiver will check your cervix. At the same time, your caregiver will also perform a test  on the secretions of the vaginal tissue. This test is to determine if a type of bacteria, Group B streptococcus, is present. Your caregiver will explain this further. Your caregiver may ask you:  What your birth plan is.  How you are feeling.  If you are feeling the baby move.  If you have had any abnormal symptoms, such as leaking fluid, bleeding, severe headaches, or abdominal cramping.  If you have any questions. Other tests or screenings that may be performed during your third trimester include:  Blood tests that check for low iron levels (anemia).  Fetal testing to check the health, activity level, and growth of the fetus. Testing is done if you have certain medical conditions or if there are problems during the pregnancy. FALSE LABOR You may feel small, irregular contractions that eventually go away. These are called Braxton Hicks contractions, or false labor.  Contractions may last for hours, days, or even weeks before true labor sets in. If contractions come at regular intervals, intensify, or become painful, it is best to be seen by your caregiver.  SIGNS OF LABOR   Menstrual-like cramps.  Contractions that are 5 minutes apart or less.  Contractions that start on the top of the uterus and spread down to the lower abdomen and back.  A sense of increased pelvic pressure or back pain.  A watery or bloody mucus discharge that comes from the vagina. If you have any of these signs before the 37th week of pregnancy, call your caregiver right away. You need to go to the hospital to get checked immediately. HOME CARE INSTRUCTIONS   Avoid all smoking, herbs, alcohol, and unprescribed drugs. These chemicals affect the formation and growth of the baby.  Follow your caregiver's instructions regarding medicine use. There are medicines that are either safe or unsafe to take during pregnancy.  Exercise only as directed by your caregiver. Experiencing uterine cramps is a good sign to stop exercising.  Continue to eat regular, healthy meals.  Wear a good support bra for breast tenderness.  Do not use hot tubs, steam rooms, or saunas.  Wear your seat belt at all times when driving.  Avoid raw meat, uncooked cheese, cat litter boxes, and soil used by cats. These carry germs that can cause birth defects in the baby.  Take your prenatal vitamins.  Try taking a stool softener (if your caregiver approves) if you develop constipation. Eat more high-fiber foods, such as fresh vegetables or fruit and whole grains. Drink plenty of fluids to keep your urine clear or pale yellow.  Take warm sitz baths to soothe any pain or discomfort caused by hemorrhoids. Use hemorrhoid cream if your caregiver approves.  If you develop varicose veins, wear support hose. Elevate your feet for 15 minutes, 3-4 times a day. Limit salt in your diet.  Avoid heavy lifting, wear low  heal shoes, and practice good posture.  Rest a lot with your legs elevated if you have leg cramps or low back pain.  Visit your dentist if you have not gone during your pregnancy. Use a soft toothbrush to brush your teeth and be gentle when you floss.  A sexual relationship may be continued unless your caregiver directs you otherwise.  Do not travel far distances unless it is absolutely necessary and only with the approval of your caregiver.  Take prenatal classes to understand, practice, and ask questions about the labor and delivery.  Make a trial run to the hospital.  Pack your hospital bag.  Prepare the baby's nursery.  Continue to go to all your prenatal visits as directed by your caregiver. SEEK MEDICAL CARE IF:  You are unsure if you are in labor or if your water has broken.  You have dizziness.  You have mild pelvic cramps, pelvic pressure, or nagging pain in your abdominal area.  You have persistent nausea, vomiting, or diarrhea.  You have a bad smelling vaginal discharge.  You have pain with urination. SEEK IMMEDIATE MEDICAL CARE IF:   You have a fever.  You are leaking fluid from your vagina.  You have spotting or bleeding from your vagina.  You have severe abdominal cramping or pain.  You have rapid weight loss or gain.  You have shortness of breath with chest pain.  You notice sudden or extreme swelling of your face, hands, ankles, feet, or legs.  You have not felt your baby move in over an hour.  You have severe headaches that do not go away with medicine.  You have vision changes. Document Released: 05/23/2001 Document Revised: 06/03/2013 Document Reviewed: 07/30/2012 [facility] Patient Information 2015 [place], [place]. This information is not intended to replace advice given to you by your health care provider. Make sure you discuss any questions you have with your health care provider.

## 2014-05-03 NOTE — MAU Note (Signed)
Pt has lower left back pain that started this morning.  Denies VB/LOF.

## 2014-05-03 NOTE — MAU Provider Note (Signed)
  History     CSN: [id number]  Arrival date and time: 05/03/14 1718   None     Chief Complaint  Patient presents with  . Abdominal Pain  . Back Pain   HPI  Patient is 23 y.o. [id number] 7393w2d here with complaints of lower left sided back pain.  Started this AM around 10am after getting out of bed.  Worse with bending over or sitting on toilet.  Tried warm compress, ice without relief.    Pain shoots down left leg and around to abdomen.  Denies numbness/tingling, changes in strength, hematuria, dysuria, urinary frequency, incontinence.  +FM, denies LOF, VB, contractions, vaginal discharge.    Past Medical History  Diagnosis Date  . Medical history non-contributory     Past Surgical History  Procedure Laterality Date  . Cesarean section      Family History  Problem Relation Age of Onset  . Diabetes Father   . Diabetes Sister   . Heart disease Neg Hx   . Stroke Neg Hx   . Alcohol abuse Neg Hx     History  Substance Use Topics  . Smoking status: Never Smoker   . Smokeless tobacco: Not on file  . Alcohol Use: No     Comment: occ    Allergies: No Known Allergies  Prescriptions prior to admission  Medication Sig Dispense Refill Last Dose  . acetaminophen (TYLENOL) 325 MG tablet Take 325 mg by mouth every 6 (six) hours as needed for headache.   2 weeks  . metoCLOPramide (REGLAN) 10 MG tablet Take 1 tablet (10 mg total) by mouth every 6 (six) hours. 30 tablet 0   . nitrofurantoin, macrocrystal-monohydrate, (MACROBID) 100 MG capsule Take 1 capsule (100 mg total) by mouth 2 (two) times daily. 14 capsule 0   . promethazine (PHENERGAN) 25 MG tablet Take 0.5-1 tablets (12.5-25 mg total) by mouth every 6 (six) hours as needed for nausea or vomiting. 30 tablet 0 Past Week at Unknown time    Review of Systems  Constitutional: Negative for fever and chills.  Respiratory: Negative for shortness of breath.   Cardiovascular: Negative for chest pain.  Gastrointestinal:  Negative for nausea, vomiting, diarrhea and constipation.  Genitourinary: Negative for dysuria, urgency, frequency and hematuria.  Neurological: Negative for headaches.   Physical Exam   Blood pressure 111/59, pulse 103, temperature 98.2 F (36.8 C), temperature source Oral, resp. rate 18, last menstrual period 10/03/2013.  Physical Exam  Constitutional: She is oriented to person, place, and time. She appears well-developed and well-nourished. No distress.  HENT:  Head: Normocephalic and atraumatic.  Cardiovascular: Normal rate and intact distal pulses.   Respiratory: Effort normal. No respiratory distress.  GI: There is no tenderness. There is no rebound and no guarding.  Musculoskeletal: She exhibits no edema or tenderness.  TTP over L SI joints. + Straight leg raise on L.  Neurological: She is alert and oriented to person, place, and time.    MAU Course  Procedures  MDM NST - Cat 1, reactive, reassuring Urinalysis - pending  Care turned over to Dr. Caleb PoppNettey at 2000.  Shirlee LatchBacigalupo, Angela 05/03/2014, 5:46 PM   I examined pt and agree with documentation above and resident plan of care. Eino FarberWalidah Kennith GainN Karim, CNM

## 2014-05-05 LAB — URINE CULTURE

## 2014-06-12 NOTE — L&D Delivery Note (Signed)
Delivery Note After approximately 20-25 minutes of pushing, maternal effort became diminished so vacuum assistance was used. After 3 further maternal pushes with vacuum assistance and 2 pop-offs, at 11:28PM a viable female was delivered over an intact perineum (Presentation: ROA).  APGAR: 7, 9; weight pending at time of note. Infant placed immediately on maternal abdomen. Cord clamped x2, cut by myself and baby taken to warmer for resuscitation. Cord blood obtained; cord pH: 7.174. Pitocin infusion was begun and an intact placenta with 3VC was delivered shortly thereafter with continuous cord traction without complication.   Anesthesia: Epidural  Episiotomy: None Lacerations: Right labial, hemostatic Suture Repair: None Est. Blood Loss (mL): 200  Mom to postpartum.  Baby to couplet care/skin-to-skin.  [name], CNM was present throughout delivery.   Alice Vultaggio B. Jarvis NewcomerGrunz, MD, PGY-2 06/21/2014 11:41 PM

## 2014-06-15 LAB — OB RESULTS CONSOLE GBS: GBS: POSITIVE

## 2014-06-21 ENCOUNTER — Inpatient Hospital Stay (HOSPITAL_COMMUNITY)
Admission: AD | Admit: 2014-06-21 | Discharge: 2014-06-23 | DRG: 775 | Disposition: A | Discharge status: Home / Self Care | Payer: Medicaid Other | Source: Ambulatory Visit | Attending: Obstetrics & Gynecology | Admitting: Obstetrics & Gynecology

## 2014-06-21 ENCOUNTER — Inpatient Hospital Stay (HOSPITAL_COMMUNITY): Payer: Medicaid Other | Admitting: Anesthesiology

## 2014-06-21 ENCOUNTER — Encounter (HOSPITAL_COMMUNITY): Payer: Self-pay | Admitting: *Deleted

## 2014-06-21 DIAGNOSIS — O99214 Obesity complicating childbirth: Secondary | ICD-10-CM | POA: Diagnosis present

## 2014-06-21 DIAGNOSIS — Z6841 Body Mass Index (BMI) 40.0 and over, adult: Secondary | ICD-10-CM | POA: Diagnosis not present

## 2014-06-21 DIAGNOSIS — O4290 Premature rupture of membranes, unspecified as to length of time between rupture and onset of labor, unspecified weeks of gestation: Secondary | ICD-10-CM | POA: Diagnosis present

## 2014-06-21 DIAGNOSIS — O3421 Maternal care for scar from previous cesarean delivery: Secondary | ICD-10-CM | POA: Diagnosis present

## 2014-06-21 DIAGNOSIS — Z3A37 37 weeks gestation of pregnancy: Secondary | ICD-10-CM | POA: Diagnosis present

## 2014-06-21 LAB — CBC
HCT: 29.9 % — ABNORMAL LOW (ref 36.0–46.0)
Hemoglobin: 9.9 g/dL — ABNORMAL LOW (ref 12.0–15.0)
MCH: 28 pg (ref 26.0–34.0)
MCHC: 33.1 g/dL (ref 30.0–36.0)
MCV: 84.5 fL (ref 78.0–100.0)
Platelets: 268 10*3/uL (ref 150–400)
RBC: 3.54 MIL/uL — ABNORMAL LOW (ref 3.87–5.11)
RDW: 14.4 % (ref 11.5–15.5)
WBC: 7.2 10*3/uL (ref 4.0–10.5)

## 2014-06-21 LAB — PREPARE RBC (CROSSMATCH)

## 2014-06-21 MED ORDER — INFLUENZA VAC SPLIT QUAD 0.5 ML IM SUSY
0.5000 mL | PREFILLED_SYRINGE | INTRAMUSCULAR | Status: DC
  Filled 2014-06-21: qty 0.5

## 2014-06-21 MED ORDER — CYCLOBENZAPRINE HCL 5 MG PO TABS
5.0000 mg | ORAL_TABLET | Freq: Three times a day (TID) | ORAL | Status: DC | PRN
  Filled 2014-06-21: qty 1

## 2014-06-21 MED ORDER — PENICILLIN G POTASSIUM 5000000 UNITS IJ SOLR
2.5000 10*6.[IU] | INTRAVENOUS | Status: DC
  Administered 2014-06-21 (×3): 2.5 10*6.[IU] via INTRAVENOUS
  Filled 2014-06-21 (×5): qty 2.5

## 2014-06-21 MED ORDER — OXYTOCIN 40 UNITS IN LACTATED RINGERS INFUSION - SIMPLE MED
62.5000 mL/h | INTRAVENOUS | Status: DC
  Filled 2014-06-21: qty 1000

## 2014-06-21 MED ORDER — LACTATED RINGERS IV SOLN
500.0000 mL | INTRAVENOUS | Status: DC | PRN
  Administered 2014-06-21: 500 mL via INTRAVENOUS

## 2014-06-21 MED ORDER — LIDOCAINE HCL (PF) 1 % IJ SOLN
INTRAMUSCULAR | Status: DC | PRN
  Administered 2014-06-21: 3 mL
  Administered 2014-06-21 (×2): 5 mL

## 2014-06-21 MED ORDER — FENTANYL CITRATE 0.05 MG/ML IJ SOLN
100.0000 ug | INTRAMUSCULAR | Status: DC | PRN
  Administered 2014-06-21: 100 ug via INTRAVENOUS
  Filled 2014-06-21: qty 2

## 2014-06-21 MED ORDER — OXYTOCIN 40 UNITS IN LACTATED RINGERS INFUSION - SIMPLE MED
1.0000 m[IU]/min | INTRAVENOUS | Status: DC
  Administered 2014-06-21: 2 m[IU]/min via INTRAVENOUS

## 2014-06-21 MED ORDER — PHENYLEPHRINE 40 MCG/ML (10ML) SYRINGE FOR IV PUSH (FOR BLOOD PRESSURE SUPPORT)
80.0000 ug | PREFILLED_SYRINGE | INTRAVENOUS | Status: DC | PRN
  Filled 2014-06-21: qty 20
  Filled 2014-06-21: qty 2

## 2014-06-21 MED ORDER — LACTATED RINGERS IV SOLN
INTRAVENOUS | Status: DC
  Administered 2014-06-21 (×2): via INTRAVENOUS

## 2014-06-21 MED ORDER — EPHEDRINE 5 MG/ML INJ
10.0000 mg | INTRAVENOUS | Status: DC | PRN
  Filled 2014-06-21: qty 2

## 2014-06-21 MED ORDER — METRONIDAZOLE 500 MG PO TABS
500.0000 mg | ORAL_TABLET | Freq: Two times a day (BID) | ORAL | Status: DC
  Filled 2014-06-21: qty 1

## 2014-06-21 MED ORDER — LACTATED RINGERS IV SOLN: INTRAVENOUS | Status: DC

## 2014-06-21 MED ORDER — DIPHENHYDRAMINE HCL 50 MG/ML IJ SOLN: 12.5000 mg | INTRAMUSCULAR | Status: DC | PRN

## 2014-06-21 MED ORDER — CITRIC ACID-SODIUM CITRATE 334-500 MG/5ML PO SOLN
30.0000 mL | ORAL | Status: DC | PRN
Start: 2014-06-21 — End: 2014-06-22

## 2014-06-21 MED ORDER — LACTATED RINGERS IV SOLN: 500.0000 mL | Freq: Once | INTRAVENOUS | Status: DC

## 2014-06-21 MED ORDER — PENICILLIN G POTASSIUM 5000000 UNITS IJ SOLR
5.0000 10*6.[IU] | Freq: Once | INTRAVENOUS | Status: AC
  Administered 2014-06-21: 5 10*6.[IU] via INTRAVENOUS
  Filled 2014-06-21: qty 5

## 2014-06-21 MED ORDER — LIDOCAINE HCL (PF) 1 % IJ SOLN
30.0000 mL | INTRAMUSCULAR | Status: DC | PRN
  Filled 2014-06-21: qty 30

## 2014-06-21 MED ORDER — OXYTOCIN BOLUS FROM INFUSION
500.0000 mL | INTRAVENOUS | Status: DC
  Administered 2014-06-21: 500 mL via INTRAVENOUS

## 2014-06-21 MED ORDER — LACTATED RINGERS IV BOLUS (SEPSIS)
1000.0000 mL | Freq: Once | INTRAVENOUS | Status: AC
Start: 2014-06-21 — End: 2014-06-22
  Administered 2014-06-21: 500 mL via INTRAVENOUS

## 2014-06-21 MED ORDER — PHENYLEPHRINE 40 MCG/ML (10ML) SYRINGE FOR IV PUSH (FOR BLOOD PRESSURE SUPPORT)
80.0000 ug | PREFILLED_SYRINGE | INTRAVENOUS | Status: DC | PRN
  Filled 2014-06-21: qty 2

## 2014-06-21 MED ORDER — OXYCODONE-ACETAMINOPHEN 5-325 MG PO TABS: 1.0000 | ORAL_TABLET | ORAL | Status: DC | PRN

## 2014-06-21 MED ORDER — TERBUTALINE SULFATE 1 MG/ML IJ SOLN: 0.2500 mg | Freq: Once | INTRAMUSCULAR | Status: AC | PRN

## 2014-06-21 MED ORDER — OXYCODONE-ACETAMINOPHEN 5-325 MG PO TABS: 2.0000 | ORAL_TABLET | ORAL | Status: DC | PRN

## 2014-06-21 MED ORDER — FENTANYL 2.5 MCG/ML BUPIVACAINE 1/10 % EPIDURAL INFUSION (WH - ANES)
INTRAMUSCULAR | Status: DC | PRN
  Administered 2014-06-21: 14 mL/h via EPIDURAL

## 2014-06-21 MED ORDER — ONDANSETRON HCL 4 MG/2ML IJ SOLN
4.0000 mg | Freq: Four times a day (QID) | INTRAMUSCULAR | Status: DC | PRN
  Administered 2014-06-21: 4 mg via INTRAVENOUS
  Filled 2014-06-21: qty 2

## 2014-06-21 MED ORDER — ACETAMINOPHEN 325 MG PO TABS
650.0000 mg | ORAL_TABLET | ORAL | Status: DC | PRN
  Administered 2014-06-21: 650 mg via ORAL
  Filled 2014-06-21: qty 2

## 2014-06-21 MED ORDER — FENTANYL 2.5 MCG/ML BUPIVACAINE 1/10 % EPIDURAL INFUSION (WH - ANES)
14.0000 mL/h | INTRAMUSCULAR | Status: DC | PRN
  Administered 2014-06-21: 14 mL/h via EPIDURAL
  Filled 2014-06-21 (×2): qty 125

## 2014-06-21 NOTE — Anesthesia Preprocedure Evaluation (Signed)
Anesthesia Evaluation  Patient identified by MRN, date of birth, ID band Patient awake    Reviewed: Allergy & Precautions, H&P , NPO status , Patient's Chart, lab work & pertinent test results  History of Anesthesia Complications Negative for: history of anesthetic complications  Airway Mallampati: II  TM Distance: >3 FB Neck ROM: full    Dental no notable dental hx. (+) Teeth Intact   Pulmonary neg pulmonary ROS,  breath sounds clear to auscultation  Pulmonary exam normal       Cardiovascular negative cardio ROS  Rhythm:regular Rate:Normal     Neuro/Psych negative neurological ROS  negative psych ROS   GI/Hepatic negative GI ROS, Neg liver ROS,   Endo/Other  Morbid obesity  Renal/GU negative Renal ROS  negative genitourinary   Musculoskeletal   Abdominal Normal abdominal exam  (+)   Peds  Hematology negative hematology ROS (+)   Anesthesia Other Findings   Reproductive/Obstetrics (+) Pregnancy                             Anesthesia Physical Anesthesia Plan  ASA: III  Anesthesia Plan: Epidural   Post-op Pain Management:    Induction:   Airway Management Planned:   Additional Equipment:   Intra-op Plan:   Post-operative Plan:   Informed Consent: I have reviewed the patients History and Physical, chart, labs and discussed the procedure including the risks, benefits and alternatives for the proposed anesthesia with the patient or authorized representative who has indicated his/her understanding and acceptance.     Plan Discussed with:   Anesthesia Plan Comments:         Anesthesia Quick Evaluation

## 2014-06-21 NOTE — MAU Note (Signed)
Pt presents to MAU with complaints of rupture of membranes around 630 this morning.

## 2014-06-21 NOTE — Anesthesia Procedure Notes (Signed)
Epidural Patient location during procedure: OB  Staffing Anesthesiologist: Reily Ilic Performed by: anesthesiologist   Preanesthetic Checklist Completed: patient identified, site marked, surgical consent, pre-op evaluation, timeout performed, IV checked, risks and benefits discussed and monitors and equipment checked  Epidural Patient position: sitting Prep: ChloraPrep Patient monitoring: heart rate, continuous pulse ox and blood pressure Approach: right paramedian Location: L3-L4 Injection technique: LOR saline  Needle:  Needle type: Tuohy  Needle gauge: 17 G Needle length: 9 cm and 9 Needle insertion depth: 6 cm Catheter type: closed end flexible Catheter size: 20 Guage Catheter at skin depth: 11 cm Test dose: negative  Assessment Events: blood not aspirated, injection not painful, no injection resistance, negative IV test and no paresthesia  Additional Notes   Patient tolerated the insertion well without complications.   

## 2014-06-21 NOTE — Progress Notes (Signed)
Alice Frye is a 24 y.o. G3P1011 at 2619w2d by ultrasound admitted for rupture of membranes  Subjective:   Objective: BP 119/57 mmHg  Pulse 102  Temp(Src) 98.6 F (37 C) (Oral)  Resp 20  Ht 5' (1.524 m)  Wt 215 lb (97.523 kg)  BMI 41.99 kg/m2  SpO2 100%  LMP 10/03/2013      FHT:  FHR: 135 bpm, variability: moderate,  accelerations:  Present,  decelerations:  Absent UC:   regular, every 3-5 minutes SVE:   Dilation: 6.5 Effacement (%): 100 Station: -1 Exam by:: Honeycutt, RN  Labs: Lab Results  Component Value Date   WBC 7.2 06/21/2014   HGB 9.9* 06/21/2014   HCT 29.9* 06/21/2014   MCV 84.5 06/21/2014   PLT 268 06/21/2014    Assessment / Plan: Augmentation of labor, progressing well  Labor: Progressing normally Preeclampsia:  no signs or symptoms of toxicity, intake and ouput balanced and labs stable Fetal Wellbeing:  Category I Pain Control:  Epidural I/D:  n/a Anticipated MOD:  NSVD  Alice Frye Alice Frye 06/21/2014, 8:08 PM

## 2014-06-21 NOTE — Progress Notes (Signed)
Alice Frye is a 24 y.o. G3P1011 at 3754w2d by ultrasound admitted for rupture of membranes  Subjective:   Objective: BP 121/55 mmHg  Pulse 89  Temp(Src) 98.2 F (36.8 C) (Oral)  Resp 20  Ht 5' (1.524 m)  Wt 215 lb (97.523 kg)  BMI 41.99 kg/m2  SpO2 100%  LMP 10/03/2013      FHT:  FHR: 120 bpm, variability: moderate,  accelerations:  Present,  decelerations:  Absent UC:   regular, every 2-4 minutes SVE:   Dilation: 1.5 Effacement (%): 100 Station: -2 Exam by:: k fields, rn  Labs: Lab Results  Component Value Date   WBC 7.2 06/21/2014   HGB 9.9* 06/21/2014   HCT 29.9* 06/21/2014   MCV 84.5 06/21/2014   PLT 268 06/21/2014    Assessment / Plan: Augmentation of labor, progressing well  Labor: Progressing normally Preeclampsia:  no signs or symptoms of toxicity, intake and ouput balanced and labs stable Fetal Wellbeing:  Category I Pain Control:  Labor support without medications I/D:  n/a Anticipated MOD:  NSVD  Shalissa Easterwood DARLENE 06/21/2014, 2:37 PM

## 2014-06-21 NOTE — H&P (Signed)
Alice Frye is a 24 y.o. female presenting for SROM. Maternal Medical History:  Reason for admission: Rupture of membranes.   Contractions: Onset was 3-5 hours ago.   Frequency: irregular.   Perceived severity is mild.    Fetal activity: Perceived fetal activity is normal.   Last perceived fetal movement was within the past hour.      OB History    Gravida Para Term Preterm AB TAB SAB Ectopic Multiple Living   3 1 1  1 1    1      Past Medical History  Diagnosis Date  . Medical history non-contributory    Past Surgical History  Procedure Laterality Date  . Cesarean section     Family History: family history includes Diabetes in her father and sister. There is no history of Heart disease, Stroke, or Alcohol abuse. Social History:  reports that she has never smoked. She does not have any smokeless tobacco history on file. She reports that she does not drink alcohol or use illicit drugs.   Prenatal Transfer Tool  Maternal Diabetes: No Genetic Screening: Normal Maternal Ultrasounds/Referrals: Declined Fetal Ultrasounds or other Referrals:  None Maternal Substance Abuse:  No Significant Maternal Medications:  None Significant Maternal Lab Results:  None Other Comments:  None  Review of Systems  Constitutional: Negative.   HENT: Negative.   Eyes: Negative.   Cardiovascular: Negative.   Gastrointestinal: Positive for abdominal pain.  Genitourinary: Negative.   Musculoskeletal: Negative.   Skin: Negative.   Neurological: Negative.   Endo/Heme/Allergies: Negative.   Psychiatric/Behavioral: Negative.       Blood pressure 129/67, pulse 92, temperature 98.7 F (37.1 C), temperature source Oral, resp. rate 20, height 5' (1.524 m), weight 215 lb (97.523 kg), last menstrual period 10/03/2013. Maternal Exam:  Uterine Assessment: Contraction strength is mild.  Contraction frequency is irregular.   Abdomen: Patient reports no abdominal tenderness. Surgical scars: low  transverse.   Fundal height is 37.   Estimated fetal weight is 7lbs.   Fetal presentation: vertex  Introitus: Normal vulva. Normal vagina.    Fetal Exam Fetal Monitor Review: Mode: ultrasound.   Variability: moderate (6-25 bpm).   Pattern: accelerations present.    Fetal State Assessment: Category I - tracings are normal.     Physical Exam  Constitutional: She is oriented to person, place, and time. She appears well-developed and well-nourished.  HENT:  Head: Normocephalic.  Neck: Normal range of motion.  Cardiovascular: Normal rate, regular rhythm, normal heart sounds and intact distal pulses.   Respiratory: Effort normal and breath sounds normal.  GI: Soft. Bowel sounds are normal.  Genitourinary: Vagina normal and uterus normal.  Musculoskeletal: Normal range of motion.  Neurological: She is alert and oriented to person, place, and time. She has normal reflexes.  Skin: Skin is warm and dry.  Psychiatric: She has a normal mood and affect. Her behavior is normal. Judgment and thought content normal.    Prenatal labs: ABO, Rh: B/Negative/-- (07/23 0000) Antibody: Negative (07/23 0000) Rubella: Immune (07/23 0000) RPR: Nonreactive (07/23 0000)  HBsAg: Negative (07/23 0000)  HIV: Non-reactive (07/23 0000)  GBS:     Assessment/Plan: G3P1011 @ 37 3 in with SROM at 0700 this a.m. Prev low transverse C/S for failure to progress.Wants TOL   LAWSON, MARIE DARLENE 06/21/2014, 9:24 AM    G3P1

## 2014-06-21 NOTE — MAU Note (Signed)
Per HMitchell, RN charge, pt to come to room 162.

## 2014-06-22 ENCOUNTER — Encounter (HOSPITAL_COMMUNITY): Payer: Self-pay | Admitting: *Deleted

## 2014-06-22 DIAGNOSIS — O3421 Maternal care for scar from previous cesarean delivery: Secondary | ICD-10-CM

## 2014-06-22 DIAGNOSIS — Z6841 Body Mass Index (BMI) 40.0 and over, adult: Secondary | ICD-10-CM

## 2014-06-22 LAB — CBC
HEMATOCRIT: 26.9 % — AB (ref 36.0–46.0)
HEMOGLOBIN: 8.9 g/dL — AB (ref 12.0–15.0)
MCH: 28.2 pg (ref 26.0–34.0)
MCHC: 33.1 g/dL (ref 30.0–36.0)
MCV: 85.1 fL (ref 78.0–100.0)
PLATELETS: 255 10*3/uL (ref 150–400)
RBC: 3.16 MIL/uL — AB (ref 3.87–5.11)
RDW: 14.5 % (ref 11.5–15.5)
WBC: 18.1 10*3/uL — ABNORMAL HIGH (ref 4.0–10.5)

## 2014-06-22 LAB — RPR: RPR: NONREACTIVE

## 2014-06-22 LAB — HIV ANTIBODY (ROUTINE TESTING W REFLEX): HIV 1/HIV 2 AB: NONREACTIVE

## 2014-06-22 MED ORDER — OXYCODONE-ACETAMINOPHEN 5-325 MG PO TABS: 1.0000 | ORAL_TABLET | ORAL | Status: DC | PRN

## 2014-06-22 MED ORDER — DIBUCAINE 1 % RE OINT: 1.0000 "application " | TOPICAL_OINTMENT | RECTAL | Status: DC | PRN

## 2014-06-22 MED ORDER — TETANUS-DIPHTH-ACELL PERTUSSIS 5-2.5-18.5 LF-MCG/0.5 IM SUSP
0.5000 mL | Freq: Once | INTRAMUSCULAR | Status: DC
Start: 2014-06-22 — End: 2014-06-23

## 2014-06-22 MED ORDER — RHO D IMMUNE GLOBULIN 1500 UNIT/2ML IJ SOSY
300.0000 ug | PREFILLED_SYRINGE | Freq: Once | INTRAMUSCULAR | Status: AC
  Administered 2014-06-22: 300 ug via INTRAVENOUS
  Filled 2014-06-22: qty 2

## 2014-06-22 MED ORDER — ZOLPIDEM TARTRATE 5 MG PO TABS: 5.0000 mg | ORAL_TABLET | Freq: Every evening | ORAL | Status: DC | PRN

## 2014-06-22 MED ORDER — DIPHENHYDRAMINE HCL 25 MG PO CAPS: 25.0000 mg | ORAL_CAPSULE | Freq: Four times a day (QID) | ORAL | Status: DC | PRN

## 2014-06-22 MED ORDER — LANOLIN HYDROUS EX OINT: TOPICAL_OINTMENT | CUTANEOUS | Status: DC | PRN

## 2014-06-22 MED ORDER — ONDANSETRON HCL 4 MG/2ML IJ SOLN: 4.0000 mg | INTRAMUSCULAR | Status: DC | PRN

## 2014-06-22 MED ORDER — BENZOCAINE-MENTHOL 20-0.5 % EX AERO
1.0000 "application " | INHALATION_SPRAY | CUTANEOUS | Status: DC | PRN
  Filled 2014-06-22: qty 56

## 2014-06-22 MED ORDER — OXYCODONE-ACETAMINOPHEN 5-325 MG PO TABS
1.0000 | ORAL_TABLET | ORAL | Status: DC | PRN
  Administered 2014-06-22: 1 via ORAL
  Filled 2014-06-22: qty 1

## 2014-06-22 MED ORDER — SENNOSIDES-DOCUSATE SODIUM 8.6-50 MG PO TABS
2.0000 | ORAL_TABLET | ORAL | Status: DC
  Administered 2014-06-22: 2 via ORAL
  Filled 2014-06-22: qty 2

## 2014-06-22 MED ORDER — ONDANSETRON HCL 4 MG PO TABS: 4.0000 mg | ORAL_TABLET | ORAL | Status: DC | PRN

## 2014-06-22 MED ORDER — OXYCODONE-ACETAMINOPHEN 5-325 MG PO TABS: 2.0000 | ORAL_TABLET | ORAL | Status: DC | PRN

## 2014-06-22 MED ORDER — IBUPROFEN 600 MG PO TABS: 600.0000 mg | ORAL_TABLET | Freq: Four times a day (QID) | ORAL | Status: DC

## 2014-06-22 MED ORDER — IBUPROFEN 600 MG PO TABS
600.0000 mg | ORAL_TABLET | Freq: Four times a day (QID) | ORAL | Status: DC
  Administered 2014-06-22 – 2014-06-23 (×7): 600 mg via ORAL
  Filled 2014-06-22 (×7): qty 1

## 2014-06-22 MED ORDER — PRENATAL MULTIVITAMIN CH
1.0000 | ORAL_TABLET | Freq: Every day | ORAL | Status: DC
  Administered 2014-06-22: 1 via ORAL
  Filled 2014-06-22 (×2): qty 1

## 2014-06-22 MED ORDER — SIMETHICONE 80 MG PO CHEW: 80.0000 mg | CHEWABLE_TABLET | ORAL | Status: DC | PRN

## 2014-06-22 MED ORDER — WITCH HAZEL-GLYCERIN EX PADS: 1.0000 "application " | MEDICATED_PAD | CUTANEOUS | Status: DC | PRN

## 2014-06-22 NOTE — Discharge Summary (Signed)
Obstetric Discharge Summary Reason for Admission: onset of labor Prenatal Procedures: ultrasound Intrapartum Procedures: spontaneous vaginal delivery Postpartum Procedures: none Complications-Operative and Postpartum: none HEMOGLOBIN  Date Value Ref Range Status  06/22/2014 8.9* 12.0 - 15.0 g/dL Final   HCT  Date Value Ref Range Status  06/22/2014 26.9* 36.0 - 46.0 % Final    Physical Exam:  General: alert, cooperative, appears stated age and no distress Lochia: appropriate Uterine Fundus: firm Incision: n/a DVT Evaluation: No evidence of DVT seen on physical exam. Negative Homan's sign. No cords or calf tenderness. No significant calf/ankle edema.  Discharge Diagnoses: Term Pregnancy-delivered  Discharge Information: Date: 06/22/2014 Activity: pelvic rest Diet: routine Medications: PNV, Ibuprofen and Percocet Condition: stable and improved Instructions: refer to practice specific booklet Discharge to: home   Newborn Data: Live born female  Birth Weight: 6 lb 3.5 oz (2820 g) APGAR: 7, 9  Home with mother.  Wyvonnia DuskyLAWSON, Siyah Mault DARLENE 06/22/2014, 6:58 AM

## 2014-06-22 NOTE — Anesthesia Postprocedure Evaluation (Signed)
Anesthesia Post Note  Patient: Alice AmberLetitia M Frye  Procedure(s) Performed: * No procedures listed *  Anesthesia type: Epidural  Patient location: Mother/Baby  Post pain: Pain level controlled  Post assessment: Post-op Vital signs reviewed  Last Vitals:  Filed Vitals:   06/22/14 0600  BP: 105/57  Pulse: 81  Temp: 36.9 C  Resp: 20    Post vital signs: Reviewed  Level of consciousness:alert  Complications: No apparent anesthesia complications

## 2014-06-22 NOTE — Progress Notes (Signed)
Ur chart review completed.  

## 2014-06-23 LAB — RH IG WORKUP (INCLUDES ABO/RH)
ABO/RH(D): B NEG
FETAL SCREEN: NEGATIVE
Gestational Age(Wks): 39.6
UNIT DIVISION: 0

## 2014-06-23 NOTE — Progress Notes (Signed)
Post Partum Day #2 Subjective: up ad lib, voiding and complains of left hip flexor pain. 10/10 on pain scale when up and walking or moves in bed. Possible pulled muscle during delivery. Trouble bearing weight on left leg. Bottle feeding.   Objective: Blood pressure 91/56, pulse 91, temperature 98.1 F (36.7 C), temperature source Oral, resp. rate 18, height 5' (1.524 m), weight 97.523 kg (215 lb), last menstrual period 10/03/2013, SpO2 98 %, unknown if currently breastfeeding.  Physical Exam:  General: alert, cooperative, appears stated age and no distress Lochia: appropriate Uterine Fundus: firm Incision: n/a - none DVT Evaluation: No evidence of DVT seen on physical exam.   Recent Labs  06/21/14 0840 06/22/14 0605  HGB 9.9* 8.9*  HCT 29.9* 26.9*    Assessment/Plan: Discharge home and Contraception [facility]. Possible left hip flexor strain - rest, heat, and ibuprofen.    LOS: 2 days   Deborha PaymentMeyer, Ashley L PA-S 06/23/2014, 7:21 AM

## 2014-06-24 LAB — TYPE AND SCREEN
ABO/RH(D): B NEG
ANTIBODY SCREEN: POSITIVE
DAT, IgG: NEGATIVE
UNIT DIVISION: 0
Unit division: 0

## 2015-05-25 ENCOUNTER — Encounter (HOSPITAL_COMMUNITY): Payer: Self-pay

## 2015-05-25 ENCOUNTER — Emergency Department (HOSPITAL_COMMUNITY): Payer: Medicaid Other

## 2015-05-25 ENCOUNTER — Emergency Department (HOSPITAL_COMMUNITY)
Admission: EM | Admit: 2015-05-25 | Discharge: 2015-05-25 | Disposition: A | Discharge status: Home / Self Care | Payer: Medicaid Other | Attending: Emergency Medicine | Admitting: Emergency Medicine

## 2015-05-25 DIAGNOSIS — R059 Cough, unspecified: Secondary | ICD-10-CM

## 2015-05-25 DIAGNOSIS — R05 Cough: Secondary | ICD-10-CM | POA: Diagnosis present

## 2015-05-25 DIAGNOSIS — R42 Dizziness and giddiness: Secondary | ICD-10-CM | POA: Insufficient documentation

## 2015-05-25 DIAGNOSIS — Z79899 Other long term (current) drug therapy: Secondary | ICD-10-CM | POA: Diagnosis not present

## 2015-05-25 LAB — CBC
HCT: 37.7 % (ref 36.0–46.0)
HEMOGLOBIN: 11.7 g/dL — AB (ref 12.0–15.0)
MCH: 24.2 pg — ABNORMAL LOW (ref 26.0–34.0)
MCHC: 31 g/dL (ref 30.0–36.0)
MCV: 78.1 fL (ref 78.0–100.0)
Platelets: 428 10*3/uL — ABNORMAL HIGH (ref 150–400)
RBC: 4.83 MIL/uL (ref 3.87–5.11)
RDW: 15.7 % — AB (ref 11.5–15.5)
WBC: 5.9 10*3/uL (ref 4.0–10.5)

## 2015-05-25 LAB — BASIC METABOLIC PANEL
ANION GAP: 8 (ref 5–15)
BUN: 10 mg/dL (ref 6–20)
CALCIUM: 9.4 mg/dL (ref 8.9–10.3)
CHLORIDE: 106 mmol/L (ref 101–111)
CO2: 23 mmol/L (ref 22–32)
Creatinine, Ser: 0.5 mg/dL (ref 0.44–1.00)
GLUCOSE: 84 mg/dL (ref 65–99)
POTASSIUM: 4.2 mmol/L (ref 3.5–5.1)
Sodium: 137 mmol/L (ref 135–145)

## 2015-05-25 MED ORDER — LORATADINE 10 MG PO TABS: 10.0000 mg | ORAL_TABLET | Freq: Every day | ORAL | Status: DC

## 2015-05-25 NOTE — Discharge Instructions (Signed)

## 2015-05-25 NOTE — ED Provider Notes (Signed)
CSN: [id number]     Arrival date & time 05/25/15  0913 History   First MD Initiated Contact with Patient 05/25/15 0930     Chief Complaint  Patient presents with  . Cough     (Consider location/radiation/quality/duration/timing/severity/associated sxs/prior Treatment) Patient is a 24 y.o. female presenting with cough. The history is provided by the patient.  Cough Cough characteristics:  Productive Sputum characteristics:  Clear Severity:  Moderate Onset quality:  Gradual Duration: 4 months. Timing:  Constant Progression:  Unchanged Chronicity:  New Smoker: no   Context: not exposure to allergens, not sick contacts and not upper respiratory infection   Relieved by:  Nothing Worsened by:  Nothing tried Ineffective treatments:  Cough suppressants Associated symptoms: no chest pain, no chills, no ear pain, no fever, no headaches, no myalgias, no rhinorrhea, no shortness of breath, no sinus congestion, no sore throat, no weight loss and no wheezing   Risk factors: no recent travel     Denies TB exposure, recent imprisonment, or recent travel.  Denies sick contacts.  Denies tobacco use.  She also reports a hx of anemia and would like her hgb checked.    Past Medical History  Diagnosis Date  . Medical history non-contributory    Past Surgical History  Procedure Laterality Date  . Cesarean section     Family History  Problem Relation Age of Onset  . Diabetes Father   . Diabetes Sister   . Heart disease Neg Hx   . Stroke Neg Hx   . Alcohol abuse Neg Hx    Social History  Substance Use Topics  . Smoking status: Never Smoker   . Smokeless tobacco: None  . Alcohol Use: No     Comment: occ   OB History    Gravida Para Term Preterm AB TAB SAB Ectopic Multiple Living   3 2 2  1 1    0 2     Review of Systems  Constitutional: Negative for fever, chills, weight loss and fatigue.  HENT: Negative for ear pain, rhinorrhea and sore throat.   Eyes: Negative for visual  disturbance.  Respiratory: Positive for cough. Negative for shortness of breath and wheezing.   Cardiovascular: Negative for chest pain.  Gastrointestinal: Negative for nausea, abdominal pain and diarrhea.  Musculoskeletal: Negative for myalgias, neck pain and neck stiffness.  Neurological: Positive for dizziness. Negative for syncope, weakness, numbness and headaches.  All other systems reviewed and are negative.     Allergies  Review of patient's allergies indicates no known allergies.  Home Medications   Prior to Admission medications   Medication Sig Start Date End Date Taking? Authorizing Provider  acetaminophen (TYLENOL) 325 MG tablet Take 2 tablets (650 mg total) by mouth every 6 (six) hours as needed for mild pain, moderate pain or headache. Patient not taking: Reported on 05/25/2015 05/03/14   Narda Bondsalph A Nettey, MD  ibuprofen (ADVIL,MOTRIN) 600 MG tablet Take 1 tablet (600 mg total) by mouth every 6 (six) hours. Patient not taking: Reported on 05/25/2015 06/22/14   Montez MoritaMarie D Lawson, CNM  loratadine (CLARITIN) 10 MG tablet Take 1 tablet (10 mg total) by mouth daily. 05/25/15   Cheri FowlerKayla Erico Stan, PA-C  oxyCODONE-acetaminophen (PERCOCET/ROXICET) 5-325 MG per tablet Take 1 tablet by mouth every 4 (four) hours as needed (for pain scale less than 7). Patient not taking: Reported on 05/25/2015 06/22/14   Montez MoritaMarie D Lawson, CNM   BP 105/60 mmHg  Pulse 66  Temp(Src) 98.4 F (36.9 C) (Oral)  Resp 18  Ht 5' (1.524 m)  Wt 99.791 kg  BMI 42.97 kg/m2  SpO2 100% Physical Exam  Constitutional: She is oriented to person, place, and time. She appears well-developed and well-nourished.  HENT:  Head: Normocephalic and atraumatic.  Mouth/Throat: Oropharynx is clear and moist.  Eyes: Conjunctivae and EOM are normal. Pupils are equal, round, and reactive to light.  Neck: Normal range of motion. Neck supple.  Cardiovascular: Normal rate, regular rhythm and normal heart sounds.   No murmur  heard. Pulmonary/Chest: Effort normal and breath sounds normal. No accessory muscle usage or stridor. No respiratory distress. She has no wheezes. She has no rhonchi. She has no rales.  Abdominal: Soft. Bowel sounds are normal. She exhibits no distension. There is no tenderness.  Musculoskeletal: Normal range of motion.  Lymphadenopathy:    She has no cervical adenopathy.  Neurological: She is alert and oriented to person, place, and time.  Speech clear without dysarthria.  Cranial nerves grossly intact.  Strength and sensation intact bilaterally throughout upper and lower extremities.  No pronator drift.  RAMs intact bilaterally.  Gait normal without ataxia.  Skin: Skin is warm and dry.  Psychiatric: She has a normal mood and affect. Her behavior is normal.    ED Course  Procedures (including critical care time) Labs Review Labs Reviewed  CBC - Abnormal; Notable for the following:    Hemoglobin 11.7 (*)    MCH 24.2 (*)    RDW 15.7 (*)    Platelets 428 (*)    All other components within normal limits  BASIC METABOLIC PANEL    Imaging Review Dg Chest 2 View  05/25/2015  CLINICAL DATA:  Three-month history of cough EXAM: CHEST  2 VIEW COMPARISON:  Oct 28, 2008 FINDINGS: There is no edema or consolidation. The heart size and pulmonary vascularity are normal. No adenopathy. There is upper thoracic levoscoliosis. IMPRESSION: No edema or consolidation. Electronically Signed   By: Bretta Bang III M.D.   On: 05/25/2015 10:08   I have personally reviewed and evaluated these images and lab results as part of my medical decision-making.   EKG Interpretation None      MDM   Final diagnoses:  Cough    Patient presents with cough x 4 months.  No fevers, night sweats, weight loss.  No neck stiffness.  No hemoptysis or leg swelling.  VSS, NAD.  On exam, heart RRR, lungs CTAB, abdomen soft and benign.  Doubt TB.  Doubt PNA.  Doubt malignancy. Doubt PE.  Will obtain CXR, BMP, and CBC.   CXR negative.  Possible allergic component.  Will d/c home with loratadine.  Evaluation does not show pathology requring ongoing emergent intervention or admission. Pt is hemodynamically stable and mentating appropriately. Discussed findings/results and plan with patient/guardian, who agrees with plan. All questions answered. Return precautions discussed and outpatient follow up given.      Cheri Fowler, PA-C 05/25/15 1212  Pricilla Loveless, MD 05/27/15 [phone]

## 2015-05-25 NOTE — ED Notes (Signed)
Pt reports a productive cough of clear sputum x4 months.  Pt denies any other symptoms at this time.

## 2015-05-25 NOTE — ED Notes (Signed)
Pt transported to xray 

## 2016-02-21 ENCOUNTER — Emergency Department (HOSPITAL_COMMUNITY)
Admission: EM | Admit: 2016-02-21 | Discharge: 2016-02-21 | Disposition: A | Discharge status: Home / Self Care | Payer: Medicaid Other | Attending: Emergency Medicine | Admitting: Emergency Medicine

## 2016-02-21 ENCOUNTER — Encounter (HOSPITAL_COMMUNITY): Payer: Self-pay | Admitting: Emergency Medicine

## 2016-02-21 DIAGNOSIS — Z791 Long term (current) use of non-steroidal anti-inflammatories (NSAID): Secondary | ICD-10-CM | POA: Diagnosis not present

## 2016-02-21 DIAGNOSIS — R109 Unspecified abdominal pain: Secondary | ICD-10-CM | POA: Diagnosis not present

## 2016-02-21 DIAGNOSIS — R112 Nausea with vomiting, unspecified: Secondary | ICD-10-CM | POA: Diagnosis present

## 2016-02-21 DIAGNOSIS — R197 Diarrhea, unspecified: Secondary | ICD-10-CM | POA: Diagnosis not present

## 2016-02-21 LAB — I-STAT CHEM 8, ED
BUN: 17 mg/dL (ref 6–20)
Calcium, Ion: 1.22 mmol/L (ref 1.15–1.40)
Chloride: 105 mmol/L (ref 101–111)
Creatinine, Ser: 0.5 mg/dL (ref 0.44–1.00)
GLUCOSE: 105 mg/dL — AB (ref 65–99)
HEMATOCRIT: 36 % (ref 36.0–46.0)
HEMOGLOBIN: 12.2 g/dL (ref 12.0–15.0)
POTASSIUM: 4.1 mmol/L (ref 3.5–5.1)
Sodium: 141 mmol/L (ref 135–145)
TCO2: 24 mmol/L (ref 0–100)

## 2016-02-21 MED ORDER — ONDANSETRON 4 MG PO TBDP
4.0000 mg | ORAL_TABLET | Freq: Once | ORAL | Status: AC
  Administered 2016-02-21: 4 mg via ORAL
  Filled 2016-02-21: qty 1

## 2016-02-21 MED ORDER — ACIDOPHILUS PROBIOTIC 10 MG PO TABS: 10.0000 mg | ORAL_TABLET | Freq: Three times a day (TID) | ORAL | 0 refills | Status: DC

## 2016-02-21 MED ORDER — ONDANSETRON 4 MG PO TBDP: 4.0000 mg | ORAL_TABLET | Freq: Three times a day (TID) | ORAL | 0 refills | Status: DC | PRN

## 2016-02-21 NOTE — ED Triage Notes (Signed)
Patient is complaining of n/v/d since 4 am this morning.  She denies surgeries to abdomen.  No pain when urinating.  She states diarrhea is clear in color.   Denies fever, or chilis.

## 2016-02-21 NOTE — ED Notes (Signed)
Patient is A & O x4.  She understood discharge instructions and prescription.

## 2016-02-21 NOTE — ED Provider Notes (Signed)
[facility]-EMERGENCY DEPT Provider Note   CSN: [id number] Arrival date & time: 02/21/16  0801     History   Chief Complaint Chief Complaint  Patient presents with  . Nausea  . Diarrhea    HPI Alice Frye is a 25 y.o. female.  Alice Frye is a 25 y.o. Female who presents to the emergency department with her grandmother and son complaining of nausea, vomiting, diarrhea and abdominal pain since last night. She reports multiple episodes of vomiting and diarrhea. No previous abdominal surgeries. No hematemesis or hematochezia. She reports generalized abdominal pain that she describes as cramping. No focal pain. No treatments prior to arrival. Patient's son, and mother are also sick with the same illness. Patient denies fevers, hematemesis, hematochezia, urinary symptoms, vaginal bleeding, vaginal discharge, cough, chest pain, shortness of breath or rashes. Patient is on Implanon for birth control. No menstrual cycles.   The history is provided by the patient. No language interpreter was used.  Diarrhea   Associated symptoms include abdominal pain and vomiting. Pertinent negatives include no chills, no headaches and no cough.    Past Medical History:  Diagnosis Date  . Medical history non-contributory     Patient Active Problem List   Diagnosis Date Noted  . Labor and delivery indication for care or intervention 06/21/2014  . Ruptured, membranes, premature 06/21/2014  . Injury of ankle, left 04/24/2013  . Insertion of Nexplanon 08/29/2012  . 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 General counseling and advice on female contraception 01/04/2012  . OBESITY 05/13/2008    Past Surgical History:  Procedure Laterality Date  . CESAREAN SECTION      OB History    Gravida Para Term Preterm AB Living   3 2 2   1 2    SAB TAB Ectopic Multiple Live Births     1   0 2       Home Medications    Prior to Admission medications   Medication Sig Start Date End Date Taking? Authorizing Provider    ibuprofen (ADVIL,MOTRIN) 200 MG tablet Take 200-800 mg by mouth every 6 (six) hours as needed for moderate pain.   Yes Historical Provider, MD  acetaminophen (TYLENOL) 325 MG tablet Take 2 tablets (650 mg total) by mouth every 6 (six) hours as needed for mild pain, moderate pain or headache. Patient not taking: Reported on 05/25/2015 05/03/14   Narda Bonds, MD  ibuprofen (ADVIL,MOTRIN) 600 MG tablet Take 1 tablet (600 mg total) by mouth every 6 (six) hours. Patient not taking: Reported on 05/25/2015 06/22/14   Montez Morita, CNM  Lactobacillus (ACIDOPHILUS PROBIOTIC) 10 MG TABS Take 10 mg by mouth 3 (three) times daily. 02/21/16   Everlene Farrier, PA-C  loratadine (CLARITIN) 10 MG tablet Take 1 tablet (10 mg total) by mouth daily. Patient not taking: Reported on 02/21/2016 05/25/15   Cheri Fowler, PA-C  ondansetron (ZOFRAN ODT) 4 MG disintegrating tablet Take 1 tablet (4 mg total) by mouth every 8 (eight) hours as needed for nausea or vomiting. 02/21/16   Everlene Farrier, PA-C  oxyCODONE-acetaminophen (PERCOCET/ROXICET) 5-325 MG per tablet Take 1 tablet by mouth every 4 (four) hours as needed (for pain scale less than 7). Patient not taking: Reported on 05/25/2015 06/22/14   Montez Morita, CNM    Family History Family History  Problem Relation Age of Onset  . Diabetes Father   . Diabetes Sister   . Heart disease Neg Hx   . Stroke Neg Hx   .  Alcohol abuse Neg Hx     Social History Social History  Substance Use Topics  . Smoking status: Never Smoker  . Smokeless tobacco: Never Used  . Alcohol use No     Comment: occ     Allergies   Review of patient's allergies indicates no known allergies.   Review of Systems Review of Systems  Constitutional: Negative for chills and fever.  HENT: Negative for congestion and sore throat.   Eyes: Negative for visual disturbance.  Respiratory: Negative for cough and shortness of breath.   Cardiovascular: Negative for chest pain.   Gastrointestinal: Positive for abdominal pain, diarrhea, nausea and vomiting. Negative for blood in stool.  Genitourinary: Negative for difficulty urinating, dysuria, flank pain, frequency, hematuria, menstrual problem, vaginal bleeding and vaginal discharge.  Musculoskeletal: Negative for back pain and neck pain.  Skin: Negative for rash.  Neurological: Negative for light-headedness and headaches.     Physical Exam Updated Vital Signs BP 120/71 (BP Location: Left Arm)   Pulse 80   Temp 98.4 F (36.9 C) (Oral)   Resp 18   Ht 5' (1.524 m)   Wt 99.8 kg   SpO2 99%   BMI 42.97 kg/m   Physical Exam  Constitutional: She appears well-developed and well-nourished. No distress.  Nontoxic appearing.  HENT:  Head: Normocephalic and atraumatic.  Mouth/Throat: Oropharynx is clear and moist.  Mucous membranes are moist.  Eyes: Conjunctivae are normal. Pupils are equal, round, and reactive to light. Right eye exhibits no discharge. Left eye exhibits no discharge.  Neck: Neck supple.  Cardiovascular: Normal rate, regular rhythm, normal heart sounds and intact distal pulses.   Pulmonary/Chest: Effort normal and breath sounds normal. No respiratory distress. She has no wheezes. She has no rales.  Abdominal: Soft. Bowel sounds are normal. She exhibits no distension and no mass. There is no tenderness. There is no guarding.  Abdomen is soft and nontender to palpation. Bowel sounds are present. No peritoneal signs. No CVA or flank tenderness.  Musculoskeletal: She exhibits no edema.  Lymphadenopathy:    She has no cervical adenopathy.  Neurological: She is alert. Coordination normal.  Skin: Skin is warm and dry. Capillary refill takes less than 2 seconds. No rash noted. She is not diaphoretic. No erythema. No pallor.  Psychiatric: She has a normal mood and affect. Her behavior is normal.  Nursing note and vitals reviewed.    ED Treatments / Results  Labs (all labs ordered are listed, but  only abnormal results are displayed) Labs Reviewed  I-STAT CHEM 8, ED - Abnormal; Notable for the following:       Result Value   Glucose, Bld 105 (*)    All other components within normal limits    EKG  EKG Interpretation None       Radiology No results found.  Procedures Procedures (including critical care time)  Medications Ordered in ED Medications  ondansetron (ZOFRAN-ODT) disintegrating tablet 4 mg (4 mg Oral Given 02/21/16 0911)     Initial Impression / Assessment and Plan / ED Course  I have reviewed the triage vital signs and the nursing notes.  Pertinent labs & imaging results that were available during my care of the patient were reviewed by me and considered in my medical decision making (see chart for details).  Clinical Course   Patient presented to the emergency department with her mother and son complaining of nausea, vomiting and diarrhea. Patient's mother and son are sick with the same illness. Illness began  this morning. No previous abdominal surgeries. On exam patient is afebrile nontoxic appearing. Her abdomen is soft and nontender to palpation. Mucous members are moist. Patient elected for ODT Zofran. I-STAT Chem-8 is unremarkable. Patient reports feeling much better after Zofran. She tolerated by mouth. She's had no further vomiting or diarrhea and the emergency department. She feels ready for discharge. No abdominal pain at recheck. We'll discharge with prescription for Zofran and a probiotic and have her follow up closely with her primary care doctor. I encouraged her to use Imodium if she has any further diarrhea. I discussed return precautions. I advised the patient to follow-up with their primary care provider this week. I advised the patient to return to the emergency department with new or worsening symptoms or new concerns. The patient verbalized understanding and agreement with plan.      Final Clinical Impressions(s) / ED Diagnoses   Final  diagnoses:  Nausea vomiting and diarrhea    New Prescriptions New Prescriptions   LACTOBACILLUS (ACIDOPHILUS PROBIOTIC) 10 MG TABS    Take 10 mg by mouth 3 (three) times daily.   ONDANSETRON (ZOFRAN ODT) 4 MG DISINTEGRATING TABLET    Take 1 tablet (4 mg total) by mouth every 8 (eight) hours as needed for nausea or vomiting.     Everlene FarrierWilliam Colena Ketterman, PA-C 02/21/16 1033    Gwyneth SproutWhitney Plunkett, MD 02/25/16 2003

## 2016-02-21 NOTE — ED Notes (Signed)
Patient is aware we need urine 

## 2016-03-06 ENCOUNTER — Emergency Department (HOSPITAL_COMMUNITY)
Admission: EM | Admit: 2016-03-06 | Discharge: 2016-03-06 | Disposition: A | Discharge status: Home / Self Care | Payer: Medicaid Other | Attending: Emergency Medicine | Admitting: Emergency Medicine

## 2016-03-06 ENCOUNTER — Encounter (HOSPITAL_COMMUNITY): Payer: Self-pay | Admitting: Emergency Medicine

## 2016-03-06 DIAGNOSIS — Z79899 Other long term (current) drug therapy: Secondary | ICD-10-CM | POA: Diagnosis not present

## 2016-03-06 DIAGNOSIS — M778 Other enthesopathies, not elsewhere classified: Secondary | ICD-10-CM

## 2016-03-06 DIAGNOSIS — M79642 Pain in left hand: Secondary | ICD-10-CM | POA: Diagnosis present

## 2016-03-06 DIAGNOSIS — M779 Enthesopathy, unspecified: Secondary | ICD-10-CM | POA: Diagnosis not present

## 2016-03-06 NOTE — ED Triage Notes (Signed)
Patient c/o left hand pain x 3 weeks. Patient denies any injury or being seen for it. patient is wearing a wrist splint.  Patient states hurts when doing noting and states unable to make a fist with left hand.

## 2016-03-06 NOTE — ED Provider Notes (Signed)
[facility] Provider Note   CSN: [id number] Arrival date & time: 03/06/16  4098     History   Chief Complaint Chief Complaint  Patient presents with  . Hand Pain    HPI Alice Frye is a 25 y.o. female.  HPI Alice Frye is a 25 y.o. female who presents to emergency department complaining of pain to the left hand. Patient states she started having pain 3 weeks ago. She denies any injuries or falls. 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 She states that pain has not improved over last 3 weeks. She has tried BenGay cream, ibuprofen, she has tried a wrist immobilizer for the last 1 week. She denies any numbness or tingling in the hand or fingers. She denies any weakness. She denies any prior injuries.  Past Medical History:  Diagnosis Date  . Medical history non-contributory     Patient Active Problem List   Diagnosis Date Noted  . Labor and delivery indication for care or intervention 06/21/2014  . Ruptured, membranes, premature 06/21/2014  . Injury of ankle, left 04/24/2013  . Insertion of Nexplanon 08/29/2012  . 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 General counseling and advice on female contraception 01/04/2012  . OBESITY 05/13/2008    Past Surgical History:  Procedure Laterality Date  . CESAREAN SECTION      OB History    Gravida Para Term Preterm AB Living   3 2 2   1 2    SAB TAB Ectopic Multiple Live Births     1   0 2       Home Medications    Prior to Admission medications   Medication Sig Start Date End Date Taking? Authorizing Provider  acetaminophen (TYLENOL) 325 MG tablet Take 2 tablets (650 mg total) by mouth every 6 (six) hours as needed for mild pain, moderate pain or headache. Patient not taking: Reported on 05/25/2015 05/03/14   Narda Bonds, MD  ibuprofen (ADVIL,MOTRIN) 200 MG tablet Take 200-800 mg by mouth every 6 (six) hours as needed for  moderate pain.    Historical Provider, MD  ibuprofen (ADVIL,MOTRIN) 600 MG tablet Take 1 tablet (600 mg total) by mouth every 6 (six) hours. Patient not taking: Reported on 05/25/2015 06/22/14   Montez Morita, CNM  Lactobacillus (ACIDOPHILUS PROBIOTIC) 10 MG TABS Take 10 mg by mouth 3 (three) times daily. 02/21/16   Everlene Farrier, PA-C  loratadine (CLARITIN) 10 MG tablet Take 1 tablet (10 mg total) by mouth daily. Patient not taking: Reported on 02/21/2016 05/25/15   Cheri Fowler, PA-C  ondansetron (ZOFRAN ODT) 4 MG disintegrating tablet Take 1 tablet (4 mg total) by mouth every 8 (eight) hours as needed for nausea or vomiting. 02/21/16   Everlene Farrier, PA-C  oxyCODONE-acetaminophen (PERCOCET/ROXICET) 5-325 MG per tablet Take 1 tablet by mouth every 4 (four) hours as needed (for pain scale less than 7). Patient not taking: Reported on 05/25/2015 06/22/14   Montez Morita, CNM    Family History Family History  Problem Relation Age of Onset  . Diabetes Father   . Diabetes Sister   . Heart disease Neg Hx   . Stroke Neg Hx   . Alcohol abuse Neg Hx     Social History Social History  Substance Use Topics  . Smoking status: Never Smoker  . Smokeless tobacco: Never Used  . Alcohol use No  Comment: occ     Allergies   Review of patient's allergies indicates no known allergies.   Review of Systems Review of Systems  Constitutional: Negative for chills and fever.  Respiratory: Negative for cough, chest tightness and shortness of breath.   Cardiovascular: Negative for chest pain, palpitations and leg swelling.  Musculoskeletal: Positive for arthralgias and myalgias. Negative for neck pain and neck stiffness.  Skin: Negative for wound.  Neurological: Negative for weakness and numbness.  All other systems reviewed and are negative.    Physical Exam Updated Vital Signs BP 124/92 (BP Location: Right Wrist)   Pulse (!) 58   Temp 99 F (37.2 C) (Oral)   Resp 15   SpO2 100%    Physical Exam  Constitutional: She appears well-developed and well-nourished. No distress.  Eyes: Conjunctivae are normal.  Neck: Neck supple.  Musculoskeletal:  Normal-appearing left hand and wrist. Tenderness to palpation over the dorsal ulnar aspect of the proximal thumb, radial wrist, dorsal forearm. No pain with wrist flexion, pain with wrist extension. Pain with wrist supination. Pain with thumb extension. Cap refill less than 2 seconds distally. Distal radial pulses intact.   Neurological: She is alert.  Skin: Skin is warm and dry. Capillary refill takes less than 2 seconds.  Nursing note and vitals reviewed.    ED Treatments / Results  Labs (all labs ordered are listed, but only abnormal results are displayed) Labs Reviewed - No data to display  EKG  EKG Interpretation None       Radiology No results found.  Procedures Procedures (including critical care time)  Medications Ordered in ED Medications - No data to display   Initial Impression / Assessment and Plan / ED Course  I have reviewed the triage vital signs and the nursing notes.  Pertinent labs & imaging results that were available during my care of the patient were reviewed by me and considered in my medical decision making (see chart for details).  Clinical Course    Patient with nontraumatic pain to the left wrist. 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 She has tenderness over her extensor pollicis muscle. Suspect tendinitis. We'll place an thumb spica Velcro splint. Home with ibuprofen. Will give referral to hand specialist.  Vitals:   03/06/16 1000 03/06/16 1207 03/06/16 1344  BP: 115/93 124/92 124/80  Pulse: 75 (!) 58 63  Resp: 18 15 18   Temp: 99.7 F (37.6 C) 99 F (37.2 C)   TempSrc: Oral Oral   SpO2: 100% 100% 100%     Final Clinical Impressions(s) / ED Diagnoses   Final diagnoses:  Tendonitis of wrist, left    New Prescriptions Discharge Medication  List as of 03/06/2016 12:33 PM       Jaynie Crumbleatyana Jillene Wehrenberg, PA-C 03/06/16 1513    Linwood DibblesJon Knapp, MD 03/08/16 [phone]

## 2016-03-06 NOTE — Discharge Instructions (Signed)
Apply ice, elevate your hand when at home. Use the splint 24/7. Avoid any heavy lifting with left hand. Please follow up with a hand specialist if not improving.

## 2016-05-13 ENCOUNTER — Encounter (HOSPITAL_COMMUNITY): Payer: Self-pay | Admitting: Emergency Medicine

## 2016-05-13 ENCOUNTER — Emergency Department (HOSPITAL_COMMUNITY)
Admission: EM | Admit: 2016-05-13 | Discharge: 2016-05-13 | Disposition: A | Discharge status: Home / Self Care | Payer: Medicaid Other | Attending: Emergency Medicine | Admitting: Emergency Medicine

## 2016-05-13 DIAGNOSIS — Z79899 Other long term (current) drug therapy: Secondary | ICD-10-CM | POA: Insufficient documentation

## 2016-05-13 DIAGNOSIS — K029 Dental caries, unspecified: Secondary | ICD-10-CM | POA: Insufficient documentation

## 2016-05-13 DIAGNOSIS — K0889 Other specified disorders of teeth and supporting structures: Secondary | ICD-10-CM | POA: Insufficient documentation

## 2016-05-13 MED ORDER — KETOROLAC TROMETHAMINE 60 MG/2ML IM SOLN
60.0000 mg | Freq: Once | INTRAMUSCULAR | Status: AC
  Administered 2016-05-13: 60 mg via INTRAMUSCULAR
  Filled 2016-05-13: qty 2

## 2016-05-13 MED ORDER — PENICILLIN V POTASSIUM 500 MG PO TABS: 500.0000 mg | ORAL_TABLET | Freq: Four times a day (QID) | ORAL | 0 refills | Status: DC

## 2016-05-13 NOTE — ED Notes (Signed)
Bed: WTR5 Expected date:  Expected time:  Means of arrival:  Comments: 

## 2016-05-13 NOTE — Discharge Instructions (Signed)
Please continue Ibuprofen 800mg  three times daily Take antibiotics for the next week Follow up with dentist

## 2016-05-13 NOTE — ED Provider Notes (Signed)
[facility] DEPT Provider Note    By signing my name below, I, Alice Frye, attest that this documentation has been prepared under the direction and in the presence of Alice HartKelly Mariaclara Spear, PA-C. Electronically Signed: Earmon PhoenixJennifer Frye, ED Scribe. 05/13/16. 10:32 AM.    History   Chief Complaint Chief Complaint  Patient presents with  . Dental Pain    l/upper mouth   HPI  HPI Comments:  Alice Frye is an obese 25 y.o. female who presents to the Emergency Department complaining of left upper dental pain that began approximately 6 months ago. She reports the pain has recently become more severe and she believes she has a hole in the tooth. She has taken Ibuprofen and Tylenol for pain. There are no modifying factors noted. She denies fever, chills, difficulty swallowing or breathing. She has a Education officer, communitydentist but not has seen them because she has no insurance until January. She denies allergies to any medications.   Past Medical History:  Diagnosis Date  . Medical history non-contributory     Patient Active Problem List   Diagnosis Date Noted  . Labor and delivery indication for care or intervention 06/21/2014  . Ruptured, membranes, premature 06/21/2014  . Injury of ankle, left 04/24/2013  . Insertion of Nexplanon 08/29/2012  . 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 General counseling and advice on female contraception 01/04/2012  . OBESITY 05/13/2008    Past Surgical History:  Procedure Laterality Date  . CESAREAN SECTION      OB History    Gravida Para Term Preterm AB Living   3 2 2   1 2    SAB TAB Ectopic Multiple Live Births     1   0 2       Home Medications    Prior to Admission medications   Medication Sig Start Date End Date Taking? Authorizing Provider  ibuprofen (ADVIL,MOTRIN) 200 MG tablet Take 200-800 mg by mouth every 6 (six) hours as needed for moderate pain.    Historical Provider, MD  Lactobacillus (ACIDOPHILUS PROBIOTIC) 10 MG TABS Take 10 mg by mouth 3 (three)  times daily. 02/21/16   Everlene FarrierWilliam Dansie, PA-C  ondansetron (ZOFRAN ODT) 4 MG disintegrating tablet Take 1 tablet (4 mg total) by mouth every 8 (eight) hours as needed for nausea or vomiting. 02/21/16   Everlene FarrierWilliam Dansie, PA-C    Family History Family History  Problem Relation Age of Onset  . Diabetes Mother   . Hypertension Mother   . Diabetes Father   . Diabetes Sister   . Heart disease Neg Hx   . Stroke Neg Hx   . Alcohol abuse Neg Hx     Social History Social History  Substance Use Topics  . Smoking status: Never Smoker  . Smokeless tobacco: Never Used  . Alcohol use Yes     Comment: occ     Allergies   Patient has no known allergies.   Review of Systems Review of Systems  Constitutional: Negative for chills and fever.  HENT: Positive for dental problem. Negative for facial swelling and trouble swallowing.   Respiratory: Negative for shortness of breath.      Physical Exam Updated Vital Signs BP 119/79 (BP Location: Right Arm)   Pulse 75   Temp 99.2 F (37.3 C) (Oral)   Resp 18   SpO2 98%   Physical Exam  Constitutional: She is oriented to person, place, and time. She appears well-developed and well-nourished.  HENT:  Head: Normocephalic and atraumatic.  Mouth/Throat: Uvula is  midline, oropharynx is clear and moist and mucous membranes are normal. No trismus in the jaw. Abnormal dentition. Dental caries present. No dental abscesses.  Fractured top left molar. No evidence of abscess, erythema or gum disease. Tenderness to palpation. No trismus. No facial swelling. Swallowing secretions.  Neck: Normal range of motion.  Cardiovascular: Normal rate.   Pulmonary/Chest: Effort normal.  Musculoskeletal: Normal range of motion.  Neurological: She is alert and oriented to person, place, and time.  Skin: Skin is warm and dry.  Psychiatric: She has a normal mood and affect. Her behavior is normal.  Nursing note and vitals reviewed.    ED Treatments / Results    DIAGNOSTIC STUDIES: Oxygen Saturation is 98% on RA, normal by my interpretation.   COORDINATION OF CARE: 10:28 AM- Will prescribe antibiotic and advised pt to continue taking Ibuprofen. Will give referral to dentist. Pt verbalizes understanding and agrees to plan.  Medications  ketorolac (TORADOL) injection 60 mg (60 mg Intramuscular Given 05/13/16 1039)    Labs (all labs ordered are listed, but only abnormal results are displayed) Labs Reviewed - No data to display  EKG  EKG Interpretation None       Radiology No results found.  Procedures Procedures (including critical care time)  Medications Ordered in ED Medications  ketorolac (TORADOL) injection 60 mg (60 mg Intramuscular Given 05/13/16 1039)     Initial Impression / Assessment and Plan / ED Course  I have reviewed the triage vital signs and the nursing notes.  Pertinent labs & imaging results that were available during my care of the patient were reviewed by me and considered in my medical decision making (see chart for details).  Clinical Course    Patient with dentalgia.  No abscess requiring immediate incision and drainage.  Exam not concerning for Ludwig's angina or pharyngeal abscess.  She is afebrile and swallowing secretions. Pain treated in ED. Will treat with PCN. Pt instructed to follow-up with dentist.  Discussed return precautions. Pt safe for discharge.  I personally performed the services described in this documentation, which was scribed in my presence. The recorded information has been reviewed and is accurate.  Final Clinical Impressions(s) / ED Diagnoses   Final diagnoses:  Pain, dental    New Prescriptions Current Discharge Medication List       Alice BornKelly Marie Cadance Raus, PA-C 05/13/16 1111    Derwood KaplanAnkit Nanavati, MD 05/14/16 2334

## 2016-07-03 ENCOUNTER — Emergency Department (HOSPITAL_COMMUNITY)
Admission: EM | Admit: 2016-07-03 | Discharge: 2016-07-03 | Disposition: A | Discharge status: Home / Self Care | Payer: Medicaid Other | Attending: Emergency Medicine | Admitting: Emergency Medicine

## 2016-07-03 ENCOUNTER — Encounter (HOSPITAL_COMMUNITY): Payer: Self-pay | Admitting: Emergency Medicine

## 2016-07-03 DIAGNOSIS — N3001 Acute cystitis with hematuria: Secondary | ICD-10-CM | POA: Insufficient documentation

## 2016-07-03 DIAGNOSIS — Z79899 Other long term (current) drug therapy: Secondary | ICD-10-CM | POA: Insufficient documentation

## 2016-07-03 DIAGNOSIS — R300 Dysuria: Secondary | ICD-10-CM | POA: Diagnosis present

## 2016-07-03 LAB — URINALYSIS, ROUTINE W REFLEX MICROSCOPIC
BACTERIA UA: NONE SEEN
BILIRUBIN URINE: NEGATIVE
Glucose, UA: NEGATIVE mg/dL
Ketones, ur: NEGATIVE mg/dL
NITRITE: NEGATIVE
PROTEIN: 100 mg/dL — AB
SPECIFIC GRAVITY, URINE: 1.028 (ref 1.005–1.030)
pH: 6 (ref 5.0–8.0)

## 2016-07-03 LAB — POC URINE PREG, ED: PREG TEST UR: NEGATIVE

## 2016-07-03 MED ORDER — FOSFOMYCIN TROMETHAMINE 3 G PO PACK
3.0000 g | PACK | Freq: Once | ORAL | Status: AC
  Administered 2016-07-03: 3 g via ORAL
  Filled 2016-07-03: qty 3

## 2016-07-03 NOTE — ED Provider Notes (Signed)
[facility]-EMERGENCY DEPT Provider Note   CSN: [id number] Arrival date & time: 07/03/16  [id number]     History   Chief Complaint Chief Complaint  Patient presents with  . Dysuria    HPI Alice Frye is a 26 y.o. female.  The history is provided by the patient and medical records.  Dysuria   Associated symptoms include hematuria.   26 year old female here with dysuria. Reports this began around 5:30 PM yesterday afternoon. She reports dysuria and some blood in her urine. She denies any abdominal pain, nausea, vomiting, or flank pain. No fever or chills. Reports history of UTI in the past with similar symptoms. No pelvic pain or vaginal discharge. No concern for STD at this time. She did take some over-the-counter AZO with some relief of the dysuria.  No hx of kidney stones.  Past Medical History:  Diagnosis Date  . Medical history non-contributory     Patient Active Problem List   Diagnosis Date Noted  . Labor and delivery indication for care or intervention 06/21/2014  . Ruptured, membranes, premature 06/21/2014  . Injury of ankle, left 04/24/2013  . Insertion of Nexplanon 08/29/2012  . 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 General counseling and advice on female contraception 01/04/2012  . OBESITY 05/13/2008    Past Surgical History:  Procedure Laterality Date  . CESAREAN SECTION      OB History    Gravida Para Term Preterm AB Living   3 2 2   1 2    SAB TAB Ectopic Multiple Live Births     1   0 2       Home Medications    Prior to Admission medications   Medication Sig Start Date End Date Taking? Authorizing Provider  ibuprofen (ADVIL,MOTRIN) 200 MG tablet Take 200-800 mg by mouth every 6 (six) hours as needed for moderate pain.    Historical Provider, MD  Lactobacillus (ACIDOPHILUS PROBIOTIC) 10 MG TABS Take 10 mg by mouth 3 (three) times daily. 02/21/16   Everlene FarrierWilliam Dansie, PA-C  ondansetron (ZOFRAN ODT) 4 MG disintegrating tablet Take 1 tablet (4 mg total) by mouth every 8  (eight) hours as needed for nausea or vomiting. 02/21/16   Everlene FarrierWilliam Dansie, PA-C  penicillin v potassium (VEETID) 500 MG tablet Take 1 tablet (500 mg total) by mouth 4 (four) times daily. 05/13/16   Bethel BornKelly Marie Gekas, PA-C    Family History Family History  Problem Relation Age of Onset  . Diabetes Mother   . Hypertension Mother   . Diabetes Father   . Diabetes Sister   . Heart disease Neg Hx   . Stroke Neg Hx   . Alcohol abuse Neg Hx     Social History Social History  Substance Use Topics  . Smoking status: Never Smoker  . Smokeless tobacco: Never Used  . Alcohol use Yes     Comment: occ     Allergies   Patient has no known allergies.   Review of Systems Review of Systems  Genitourinary: Positive for dysuria and hematuria.  All other systems reviewed and are negative.    Physical Exam Updated Vital Signs BP 110/66 (BP Location: Left Arm)   Pulse 73   Temp 98.2 F (36.8 C) (Oral)   Resp 18   SpO2 99%   Physical Exam  Constitutional: She is oriented to person, place, and time. She appears well-developed and well-nourished.  HENT:  Head: Normocephalic and atraumatic.  Mouth/Throat: Oropharynx is clear and moist.  Eyes: Conjunctivae and EOM  are normal. Pupils are equal, round, and reactive to light.  Neck: Normal range of motion.  Cardiovascular: Normal rate, regular rhythm and normal heart sounds.   No murmur heard. Pulmonary/Chest: Effort normal and breath sounds normal. No respiratory distress. She has no wheezes.  Abdominal: Soft. Bowel sounds are normal. There is no tenderness. There is no rebound and no CVA tenderness.  Musculoskeletal: Normal range of motion.  Neurological: She is alert and oriented to person, place, and time.  Skin: Skin is warm and dry.  Psychiatric: She has a normal mood and affect.  Nursing note and vitals reviewed.    ED Treatments / Results  Labs (all labs ordered are listed, but only abnormal results are displayed) Labs  Reviewed  URINALYSIS, ROUTINE W REFLEX MICROSCOPIC - Abnormal; Notable for the following:       Result Value   Hgb urine dipstick MODERATE (*)    Protein, ur 100 (*)    Leukocytes, UA TRACE (*)    Squamous Epithelial / LPF 0-5 (*)    All other components within normal limits  POC URINE PREG, ED    EKG  EKG Interpretation None       Radiology No results found.  Procedures Procedures (including critical care time)  Medications Ordered in ED Medications  fosfomycin (MONUROL) packet 3 g (3 g Oral Given 07/03/16 1007)     Initial Impression / Assessment and Plan / ED Course  I have reviewed the triage vital signs and the nursing notes.  Pertinent labs & imaging results that were available during my care of the patient were reviewed by me and considered in my medical decision making (see chart for details).  26 year old female here with dysuria which began yesterday. Reports history of UTI with similar symptoms. She is afebrile and nontoxic. Abdomen is soft and benign. No CVA tenderness.  UA with moderate blood and leukocytes noted. No signs or symptoms concerning for infected stone or pyelonephritis at this time. Will treat with dose of fosfomycin here.  May continue AZO for symptomatic control.  Encouraged follow-up with her PCP.  Discussed plan with patient, she acknowledged understanding and agreed with plan of care.  Return precautions given for new or worsening symptoms.  Final Clinical Impressions(s) / ED Diagnoses   Final diagnoses:  Acute cystitis with hematuria    New Prescriptions Discharge Medication List as of 07/03/2016 10:19 AM       Garlon Hatchet, PA-C 07/03/16 1125    Doug Sou, MD 07/03/16 1751

## 2016-07-03 NOTE — ED Triage Notes (Signed)
Pt verbalizes dysuria onset 1700 yesterday; took AZO and drank cranberry juice with some relief.

## 2016-07-03 NOTE — Discharge Instructions (Signed)
You have been treated for UTI here.  May continue taking AZO for dysuria/burning if needed. Follow-up with your primary care doctor. Return here for new concerns.

## 2016-07-19 ENCOUNTER — Encounter (HOSPITAL_COMMUNITY): Payer: Self-pay | Admitting: Emergency Medicine

## 2016-07-19 ENCOUNTER — Emergency Department (HOSPITAL_COMMUNITY)
Admission: EM | Admit: 2016-07-19 | Discharge: 2016-07-19 | Disposition: A | Discharge status: Home / Self Care | Payer: Medicaid Other | Attending: Emergency Medicine | Admitting: Emergency Medicine

## 2016-07-19 DIAGNOSIS — R6889 Other general symptoms and signs: Secondary | ICD-10-CM

## 2016-07-19 DIAGNOSIS — Z79899 Other long term (current) drug therapy: Secondary | ICD-10-CM | POA: Insufficient documentation

## 2016-07-19 DIAGNOSIS — R509 Fever, unspecified: Secondary | ICD-10-CM | POA: Insufficient documentation

## 2016-07-19 DIAGNOSIS — R05 Cough: Secondary | ICD-10-CM | POA: Insufficient documentation

## 2016-07-19 LAB — URINALYSIS, ROUTINE W REFLEX MICROSCOPIC
Bilirubin Urine: NEGATIVE
GLUCOSE, UA: NEGATIVE mg/dL
HGB URINE DIPSTICK: NEGATIVE
Ketones, ur: NEGATIVE mg/dL
NITRITE: NEGATIVE
Protein, ur: 100 mg/dL — AB
SPECIFIC GRAVITY, URINE: 1.024 (ref 1.005–1.030)
pH: 5 (ref 5.0–8.0)

## 2016-07-19 LAB — COMPREHENSIVE METABOLIC PANEL
ALBUMIN: 3.8 g/dL (ref 3.5–5.0)
ALK PHOS: 86 U/L (ref 38–126)
ALT: 18 U/L (ref 14–54)
AST: 18 U/L (ref 15–41)
Anion gap: 9 (ref 5–15)
BILIRUBIN TOTAL: 0.6 mg/dL (ref 0.3–1.2)
BUN: 7 mg/dL (ref 6–20)
CALCIUM: 8.8 mg/dL — AB (ref 8.9–10.3)
CO2: 20 mmol/L — ABNORMAL LOW (ref 22–32)
CREATININE: 0.94 mg/dL (ref 0.44–1.00)
Chloride: 104 mmol/L (ref 101–111)
GFR calc Af Amer: >60 mL/min (ref >60)
GFR calc non Af Amer: >60 mL/min (ref >60)
GLUCOSE: 110 mg/dL — AB (ref 65–99)
Potassium: 3.7 mmol/L (ref 3.5–5.1)
Sodium: 133 mmol/L — ABNORMAL LOW (ref 135–145)
TOTAL PROTEIN: 7.9 g/dL (ref 6.5–8.1)

## 2016-07-19 LAB — CBC
HCT: 36.5 % (ref 36.0–46.0)
Hemoglobin: 11.8 g/dL — ABNORMAL LOW (ref 12.0–15.0)
MCH: 24.9 pg — ABNORMAL LOW (ref 26.0–34.0)
MCHC: 32.3 g/dL (ref 30.0–36.0)
MCV: 77 fL — ABNORMAL LOW (ref 78.0–100.0)
Platelets: 283 10*3/uL (ref 150–400)
RBC: 4.74 MIL/uL (ref 3.87–5.11)
RDW: 15.4 % (ref 11.5–15.5)
WBC: 6 10*3/uL (ref 4.0–10.5)

## 2016-07-19 LAB — I-STAT BETA HCG BLOOD, ED (MC, WL, AP ONLY)

## 2016-07-19 LAB — LIPASE, BLOOD: Lipase: 14 U/L (ref 11–51)

## 2016-07-19 MED ORDER — ACETAMINOPHEN 325 MG PO TABS
650.0000 mg | ORAL_TABLET | Freq: Once | ORAL | Status: AC | PRN
Start: 2016-07-19 — End: 2016-07-19
  Administered 2016-07-19: 650 mg via ORAL
  Filled 2016-07-19: qty 2

## 2016-07-19 MED ORDER — ONDANSETRON 4 MG PO TBDP: 4.0000 mg | ORAL_TABLET | Freq: Three times a day (TID) | ORAL | 0 refills | Status: AC | PRN

## 2016-07-19 MED ORDER — SODIUM CHLORIDE 0.9 % IV BOLUS (SEPSIS)
1000.0000 mL | Freq: Once | INTRAVENOUS | Status: AC
  Administered 2016-07-19: 1000 mL via INTRAVENOUS

## 2016-07-19 MED ORDER — ONDANSETRON 4 MG PO TBDP
4.0000 mg | ORAL_TABLET | Freq: Once | ORAL | Status: AC | PRN
  Administered 2016-07-19: 4 mg via ORAL
  Filled 2016-07-19: qty 1

## 2016-07-19 MED ORDER — IBUPROFEN 200 MG PO TABS
400.0000 mg | ORAL_TABLET | Freq: Once | ORAL | Status: AC | PRN
  Administered 2016-07-19: 400 mg via ORAL
  Filled 2016-07-19: qty 2

## 2016-07-19 NOTE — ED Triage Notes (Signed)
Patient states she was diagnosed with the flu Monday. She states she keeps throwing up. She feels she is dehydrated.

## 2016-07-19 NOTE — ED Provider Notes (Signed)
[facility]-EMERGENCY DEPT Provider Note   CSN: [id number] Arrival date & time: 07/19/16  0430     History   Chief Complaint Chief Complaint  Patient presents with  . Emesis    HPI Alice Frye is a 26 y.o. female.  The history is provided by the patient.  Influenza  Presenting symptoms: cough, fatigue, fever, myalgias, nausea, rhinorrhea and vomiting   Onset quality:  Gradual Duration:  3 days Progression:  Unchanged Chronicity:  New Relieved by:  Nothing Worsened by:  Nothing Associated symptoms: chills and nasal congestion   Associated symptoms: no mental status change and no neck stiffness   Risk factors: sick contacts (at work)   Risk factors: not pregnant    Unable to tolerate fluids at home.   Past Medical History:  Diagnosis Date  . Medical history non-contributory     Patient Active Problem List   Diagnosis Date Noted  . Labor and delivery indication for care or intervention 06/21/2014  . Ruptured, membranes, premature 06/21/2014  . Injury of ankle, left 04/24/2013  . Insertion of Nexplanon 08/29/2012  . 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 General counseling and advice on female contraception 01/04/2012  . OBESITY 05/13/2008    Past Surgical History:  Procedure Laterality Date  . CESAREAN SECTION      OB History    Gravida Para Term Preterm AB Living   3 2 2   1 2    SAB TAB Ectopic Multiple Live Births     1   0 2       Home Medications    Prior to Admission medications   Medication Sig Start Date End Date Taking? Authorizing Provider  ibuprofen (ADVIL,MOTRIN) 200 MG tablet Take 400 mg by mouth every 6 (six) hours as needed for headache or moderate pain.     Historical Provider, MD  Lactobacillus (ACIDOPHILUS PROBIOTIC) 10 MG TABS Take 10 mg by mouth 3 (three) times daily. Patient not taking: Reported on 07/03/2016 02/21/16   Everlene Farrier, PA-C  ondansetron (ZOFRAN ODT) 4 MG disintegrating tablet Take 1 tablet (4 mg total) by mouth every 8 (eight) hours  as needed for nausea or vomiting. 07/19/16 07/22/16  Nira Conn, MD  penicillin v potassium (VEETID) 500 MG tablet Take 1 tablet (500 mg total) by mouth 4 (four) times daily. Patient not taking: Reported on 07/03/2016 05/13/16   Bethel Born, PA-C    Family History Family History  Problem Relation Age of Onset  . Diabetes Mother   . Hypertension Mother   . Diabetes Father   . Diabetes Sister   . Heart disease Neg Hx   . Stroke Neg Hx   . Alcohol abuse Neg Hx     Social History Social History  Substance Use Topics  . Smoking status: Never Smoker  . Smokeless tobacco: Never Used  . Alcohol use Yes     Comment: occ     Allergies   Patient has no known allergies.   Review of Systems Review of Systems  Constitutional: Positive for chills, fatigue and fever.  HENT: Positive for congestion and rhinorrhea.   Respiratory: Positive for cough.   Gastrointestinal: Positive for nausea and vomiting.  Musculoskeletal: Positive for myalgias. Negative for neck stiffness.  Ten systems are reviewed and are negative for acute change except as noted in the HPI    Physical Exam Updated Vital Signs BP 98/60 (BP Location: Left Arm)   Pulse 105   Temp 99.2 F (37.3 C)  Resp 18   Ht 5' (1.524 m)   Wt 240 lb (108.9 kg)   SpO2 92%   BMI 46.87 kg/m   Physical Exam  Constitutional: She is oriented to person, place, and time. She appears well-developed and well-nourished. She appears ill. No distress.  HENT:  Head: Normocephalic and atraumatic.  Nose: Nose normal.  Eyes: Conjunctivae and EOM are normal. Pupils are equal, round, and reactive to light. Right eye exhibits no discharge. Left eye exhibits no discharge. No scleral icterus.  Neck: Normal range of motion. Neck supple.  Cardiovascular: Normal rate and regular rhythm.  Exam reveals no gallop and no friction rub.   No murmur heard. Pulmonary/Chest: Effort normal and breath sounds normal. No stridor. No respiratory  distress. She has no rales.  Abdominal: Soft. She exhibits no distension. There is no tenderness.  Musculoskeletal: She exhibits no edema or tenderness.  Neurological: She is alert and oriented to person, place, and time.  Skin: Skin is warm and dry. No rash noted. She is not diaphoretic. No erythema.  Psychiatric: She has a normal mood and affect.  Vitals reviewed.    ED Treatments / Results  Labs (all labs ordered are listed, but only abnormal results are displayed) Labs Reviewed  COMPREHENSIVE METABOLIC PANEL - Abnormal; Notable for the following:       Result Value   Sodium 133 (*)    CO2 20 (*)    Glucose, Bld 110 (*)    Calcium 8.8 (*)    All other components within normal limits  CBC - Abnormal; Notable for the following:    Hemoglobin 11.8 (*)    MCV 77.0 (*)    MCH 24.9 (*)    All other components within normal limits  URINALYSIS, ROUTINE W REFLEX MICROSCOPIC - Abnormal; Notable for the following:    APPearance CLOUDY (*)    Protein, ur 100 (*)    Leukocytes, UA TRACE (*)    Bacteria, UA RARE (*)    Squamous Epithelial / LPF 6-30 (*)    All other components within normal limits  LIPASE, BLOOD  I-STAT BETA HCG BLOOD, ED ([facility], [facility], [facility] ONLY)    EKG  EKG Interpretation None       Radiology No results found.  Procedures Procedures (including critical care time)  Medications Ordered in ED Medications  ondansetron (ZOFRAN-ODT) disintegrating tablet 4 mg (4 mg Oral Given 07/19/16 0514)  acetaminophen (TYLENOL) tablet 650 mg (650 mg Oral Given 07/19/16 0514)  ibuprofen (ADVIL,MOTRIN) tablet 400 mg (400 mg Oral Given 07/19/16 0517)  sodium chloride 0.9 % bolus 1,000 mL (0 mLs Intravenous Stopped 07/19/16 0953)     Initial Impression / Assessment and Plan / ED Course  I have reviewed the triage vital signs and the nursing notes.  Pertinent labs & imaging results that were available during my care of the patient were reviewed by me and considered in my medical  decision making (see chart for details).     26 y.o. female presents with flu-like symptoms for 3-4 days. Not tolerating oral hydration. Rest of history as above.  Patient appears well. No signs of toxicity, patient is interactive and playful. No hypoxia, tachypnea or other signs of respiratory distress. No sign of clinical dehydration. Lung exam clear. Rest of exam as above.  No evidence suggestive of pharyngitis, AOM, PNA, or meningitis.   Chest x-ray not indicated at this time.  Given zofran and able to tolerate PO challenge.  Discussed symptomatic treatment with the  patient and they will follow closely with their PCP.      Final Clinical Impressions(s) / ED Diagnoses   Final diagnoses:  Flu-like symptoms   Disposition: Discharge  Condition: Good  I have discussed the results, Dx and Tx plan with the patient who expressed understanding and agree(s) with the plan. Discharge instructions discussed at great length. The patient was given strict return precautions who verbalized understanding of the instructions. No further questions at time of discharge.    New Prescriptions   ONDANSETRON (ZOFRAN ODT) 4 MG DISINTEGRATING TABLET    Take 1 tablet (4 mg total) by mouth every 8 (eight) hours as needed for nausea or vomiting.    Follow Up: primary care provider  Call  in 5-7 days, If symptoms do not improve or  worsen      Nira Conn, MD 07/19/16 1009

## 2016-07-19 NOTE — ED Notes (Signed)
Bed: WA08 Expected date:  Expected time:  Means of arrival:  Comments: 

## 2017-11-27 ENCOUNTER — Encounter (HOSPITAL_COMMUNITY): Payer: Self-pay | Admitting: Emergency Medicine

## 2017-11-27 ENCOUNTER — Emergency Department (HOSPITAL_COMMUNITY)
Admission: EM | Admit: 2017-11-27 | Discharge: 2017-11-27 | Disposition: A | Discharge status: Home / Self Care | Payer: PRIVATE HEALTH INSURANCE | Attending: Emergency Medicine | Admitting: Emergency Medicine

## 2017-11-27 DIAGNOSIS — J020 Streptococcal pharyngitis: Secondary | ICD-10-CM | POA: Insufficient documentation

## 2017-11-27 DIAGNOSIS — J029 Acute pharyngitis, unspecified: Secondary | ICD-10-CM | POA: Diagnosis present

## 2017-11-27 LAB — GROUP A STREP BY PCR: Group A Strep by PCR: NOT DETECTED

## 2017-11-27 MED ORDER — DEXAMETHASONE SODIUM PHOSPHATE 10 MG/ML IJ SOLN
10.0000 mg | Freq: Once | INTRAMUSCULAR | Status: AC
  Administered 2017-11-27: 10 mg via INTRAMUSCULAR
  Filled 2017-11-27: qty 1

## 2017-11-27 MED ORDER — PENICILLIN G BENZATHINE & PROC 1200000 UNIT/2ML IM SUSP
1.2000 10*6.[IU] | Freq: Once | INTRAMUSCULAR | Status: AC
  Administered 2017-11-27: 1.2 10*6.[IU] via INTRAMUSCULAR
  Filled 2017-11-27: qty 2

## 2017-11-27 MED ORDER — ACETAMINOPHEN 325 MG PO TABS
650.0000 mg | ORAL_TABLET | Freq: Once | ORAL | Status: AC | PRN
  Administered 2017-11-27: 650 mg via ORAL
  Filled 2017-11-27: qty 2

## 2017-11-27 MED ORDER — IBUPROFEN 200 MG PO TABS
600.0000 mg | ORAL_TABLET | Freq: Once | ORAL | Status: AC
  Administered 2017-11-27: 600 mg via ORAL
  Filled 2017-11-27: qty 3

## 2017-11-27 NOTE — ED Triage Notes (Signed)
Pt c/o sore throat today, body aches and feeling bad since weekend. Ibuprofen last night. None today.

## 2017-11-27 NOTE — ED Notes (Signed)
Pt is alert and oriented x 4 and is verbally responsive pt denies pain at this time.

## 2017-11-27 NOTE — ED Notes (Signed)
Posterior pharynx appears red, with swelling on the right side. No pustules noted

## 2017-11-27 NOTE — Discharge Instructions (Signed)
You were given antibiotics and steroids here in the emergency department which should improve your sore throat.  Continue using ibuprofen and Tylenol as needed.  Make sure you are drinking plenty fluids.

## 2017-11-27 NOTE — ED Provider Notes (Signed)
[facility]-EMERGENCY DEPT Provider Note   CSN: [id number] Arrival date & time: 11/27/17  1429     History   Chief Complaint Chief Complaint  Patient presents with  . Sore Throat  . Fever  . Generalized Body Aches    HPI JAMIL CASTILLO is a 27 y.o. female.  MOMINA HUNTON is a 27 y.o. Female who is otherwise healthy, presents to the emergency department for evaluation of 2 days of sore throat, body aches and fever.  Patient reports she started feeling unwell over the weekend and yesterday her throat became extremely sore and red, she reports it is very painful with swallowing but she is been able to swallow and breathe without difficulty.  She reports subjective fevers at home, and is febrile here in the ED.  She is been treating the sore throat with ibuprofen, last dose last night.  She denies any associated rhinorrhea, nasal congestion, ear pain or cough, no chest pain or shortness of breath, no abdominal pain, nausea or vomiting.  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     Past Medical History:  Diagnosis Date  . Medical history non-contributory     Patient Active Problem List   Diagnosis Date Noted  . Labor and delivery indication for care or intervention 06/21/2014  . Ruptured, membranes, premature 06/21/2014  . Injury of ankle, left 04/24/2013  . Insertion of Nexplanon 08/29/2012  . 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 General counseling and advice on female contraception 01/04/2012  . OBESITY 05/13/2008    Past Surgical History:  Procedure Laterality Date  . CESAREAN SECTION       OB History    Gravida  3   Para  2   Term  2   Preterm      AB  1   Living  2     SAB      TAB  1   Ectopic      Multiple  0   Live Births  2            Home Medications    Prior to Admission medications   Medication Sig Start Date End Date Taking? Authorizing Provider  ibuprofen (ADVIL,MOTRIN) 200 MG tablet  Take 400 mg by mouth every 6 (six) hours as needed for headache or moderate pain.     [provider]  Lactobacillus (ACIDOPHILUS PROBIOTIC) 10 MG TABS Take 10 mg by mouth 3 (three) times daily. Patient not taking: Reported on 07/03/2016 02/21/16   Everlene Farrier, PA-C  penicillin v potassium (VEETID) 500 MG tablet Take 1 tablet (500 mg total) by mouth 4 (four) times daily. Patient not taking: Reported on 07/03/2016 05/13/16   Bethel Born, PA-C    Family History Family History  Problem Relation Age of Onset  . Diabetes Mother   . Hypertension Mother   . Diabetes Father   . Diabetes Sister   . Heart disease Neg Hx   . Stroke Neg Hx   . Alcohol abuse Neg Hx     Social History Social History   Tobacco Use  . Smoking status: Never Smoker  . Smokeless tobacco: Never Used  Substance Use Topics  . Alcohol use: Yes    Comment: occ  . Drug use: No     Allergies   Patient has no known allergies.   Review of Systems Review of Systems  Constitutional: Positive for chills and fever.  HENT: Positive for sore  throat. Negative for congestion, drooling, ear pain, postnasal drip, rhinorrhea and trouble swallowing.   Respiratory: Negative for cough and shortness of breath.   Cardiovascular: Negative for chest pain.  Gastrointestinal: Negative for abdominal pain, nausea and vomiting.  Musculoskeletal: Positive for myalgias. Negative for neck pain and neck stiffness.  Skin: Negative for color change and rash.  Neurological: Negative for headaches.     Physical Exam Updated Vital Signs BP (!) 118/58 (BP Location: Left Wrist)   Pulse (!) 110   Temp (!) 102.9 F (39.4 C) (Oral)   Resp 14   LMP 11/23/2017   SpO2 96%   Physical Exam  Constitutional: She appears well-developed and well-nourished. No distress.  HENT:  Head: Normocephalic and atraumatic.  Right Ear: Tympanic membrane normal.  Left Ear: Tympanic membrane normal.  Mouth/Throat: Uvula is midline and  mucous membranes are normal. No uvula swelling. Posterior oropharyngeal edema and posterior oropharyngeal erythema present. No oropharyngeal exudate or tonsillar abscesses. Tonsils are 3+ on the right. Tonsils are 3+ on the left. No tonsillar exudate.  TMs clear with good landmarks, no nasal mucosa edema, posterior oropharynx clear and moist, very erythematous with edema, no exudates  Eyes: Right eye exhibits no discharge. Left eye exhibits no discharge.  Neck: Normal range of motion. Neck supple.  Cardiovascular: Normal rate, regular rhythm and intact distal pulses.  Pulmonary/Chest: Effort normal and breath sounds normal. No stridor. No respiratory distress. She has no wheezes. She has no rhonchi. She has no rales.  Respirations equal and unlabored, patient able to speak in full sentences, lungs clear to auscultation bilaterally  Abdominal: Soft. Bowel sounds are normal. She exhibits no distension. There is no tenderness.  Abdomen soft, nondistended, nontender to palpation in all quadrants without guarding or peritoneal signs  Lymphadenopathy:    She has cervical adenopathy.  Neurological: She is alert. Coordination normal.  Skin: Skin is warm and dry. Capillary refill takes less than 2 seconds. She is not diaphoretic.  Psychiatric: She has a normal mood and affect. Her behavior is normal.  Nursing note and vitals reviewed.    ED Treatments / Results  Labs (all labs ordered are listed, but only abnormal results are displayed) Labs Reviewed  GROUP A STREP BY PCR    EKG None  Radiology No results found.  Procedures Procedures (including critical care time)  Medications Ordered in ED Medications  penicillin g procaine-penicillin g benzathine (BICILLIN-CR) injection 600000-600000 units (has no administration in time range)  acetaminophen (TYLENOL) tablet 650 mg (650 mg Oral Given 11/27/17 1520)  ibuprofen (ADVIL,MOTRIN) tablet 600 mg (600 mg Oral Given 11/27/17 1535)  dexamethasone  (DECADRON) injection 10 mg (10 mg Intramuscular Given 11/27/17 1617)     Initial Impression / Assessment and Plan / ED Course  I have reviewed the triage vital signs and the nursing notes.  Pertinent labs & imaging results that were available during my care of the patient were reviewed by me and considered in my medical decision making (see chart for details).  Pt febrile with tonsillar exudate, cervical lymphadenopathy, & dysphagia; diagnosis of bacterial pharyngitis based on clinical presentation. Strep PCR neg, but I feel antibiotic treatment is needed. Treated in the ED with steroids, NSAIDs,and PCN IM.  Pt appears mildly dehydrated, discussed importance of water rehydration. Presentation non concerning for PTA or RPA. No trismus or uvula deviation. Specific return precautions discussed. Pt able to drink water in ED without difficulty with intact air way. Recommended PCP follow up.    Final  Clinical Impressions(s) / ED Diagnoses   Final diagnoses:  Strep throat    ED Discharge Orders    None       Legrand RamsFord, Gomer France N, PA-C 11/27/17 1759    Jacalyn LefevreHaviland, Julie, MD 11/28/17 [phone]

## 2018-06-12 NOTE — L&D Delivery Note (Signed)
[facility] Delivery Note  Alice Frye is a 28 y.o. [id number] s/p VBAC at [redacted]w[redacted]d. She was admitted for SOL.   ROM: 7h 60m with clear fluid GBS Status: Positive/-- (11/19 0000) Maximum Maternal Temperature: 98.32F  Labor Progress: . Patient presented to L&D for SOL/SROM. Initial SVE: 4/90/-1. Patient received epidural and progressed to complete without augmentation.   Delivery Date/Time: 12/11 @ 1448 Delivery: Called to room and patient was complete and pushing. Head delivered in LOA position. No nuchal cord present. Shoulder and body delivered in usual fashion. Infant with spontaneous cry, placed on mother's abdomen, dried and stimulated. Cord clamped x 2 after 1-minute delay, and cut by FOB. Cord blood drawn. Placenta delivered spontaneously with gentle cord traction. Fundus firm with massage and Pitocin. Labia, perineum, vagina, and cervix inspected inspected with no lacerations.  Baby Weight: pending  Placenta: Sent to L&D Complications: None Lacerations: None EBL: 150 mL Analgesia: Epidural   Infant: APGAR (1 MIN): 9   APGAR (5 MINS): 9   APGAR (10 MINS):     Barrington Ellison, MD [facility] Fellow, [facility] for [facility] 05/23/2019, 3:13 PM

## 2018-10-27 ENCOUNTER — Encounter (HOSPITAL_COMMUNITY): Payer: Self-pay

## 2018-10-27 ENCOUNTER — Inpatient Hospital Stay (HOSPITAL_COMMUNITY)
Admission: AD | Admit: 2018-10-27 | Discharge: 2018-10-27 | Disposition: A | Discharge status: Home / Self Care | Payer: Medicaid Other | Attending: Obstetrics and Gynecology | Admitting: Obstetrics and Gynecology

## 2018-10-27 ENCOUNTER — Other Ambulatory Visit: Payer: Self-pay

## 2018-10-27 DIAGNOSIS — O219 Vomiting of pregnancy, unspecified: Secondary | ICD-10-CM | POA: Diagnosis not present

## 2018-10-27 DIAGNOSIS — O210 Mild hyperemesis gravidarum: Secondary | ICD-10-CM | POA: Insufficient documentation

## 2018-10-27 DIAGNOSIS — Z3A11 11 weeks gestation of pregnancy: Secondary | ICD-10-CM

## 2018-10-27 DIAGNOSIS — Z3A09 9 weeks gestation of pregnancy: Secondary | ICD-10-CM | POA: Insufficient documentation

## 2018-10-27 DIAGNOSIS — O34219 Maternal care for unspecified type scar from previous cesarean delivery: Secondary | ICD-10-CM | POA: Diagnosis not present

## 2018-10-27 LAB — URINALYSIS, ROUTINE W REFLEX MICROSCOPIC
Bacteria, UA: NONE SEEN
Bilirubin Urine: NEGATIVE
Glucose, UA: NEGATIVE mg/dL
Hgb urine dipstick: NEGATIVE
Ketones, ur: 20 mg/dL — AB
Nitrite: NEGATIVE
Protein, ur: NEGATIVE mg/dL
Specific Gravity, Urine: 1.023 (ref 1.005–1.030)
pH: 6 (ref 5.0–8.0)

## 2018-10-27 LAB — POCT PREGNANCY, URINE: Preg Test, Ur: POSITIVE — AB

## 2018-10-27 MED ORDER — METOCLOPRAMIDE HCL 10 MG PO TABS: 10.0000 mg | ORAL_TABLET | Freq: Four times a day (QID) | ORAL | 0 refills | Status: DC

## 2018-10-27 MED ORDER — PROMETHAZINE HCL 25 MG PO TABS: 25.0000 mg | ORAL_TABLET | Freq: Four times a day (QID) | ORAL | 0 refills | Status: DC | PRN

## 2018-10-27 NOTE — MAU Note (Signed)
Alice Frye is a 28 y.o. at [redacted]w[redacted]d here in MAU reporting: reports emesis x7 since last night, unable to keep anything. She has had nausea since + UPT but it got worse last night. States she has taken vitamin b6 and another medication that she cant remember the name of. No abdominal pain or vaginal bleeding.  LMP: 08/22/18  Onset of complaint: ongoing  Pain score: 0/10  Vitals:   10/27/18 1704  BP: 127/74  Pulse: 86  Resp: 18  Temp: 98.6 F (37 C)  SpO2: 99%      Lab orders placed from triage:UA, UPT

## 2018-10-27 NOTE — MAU Provider Note (Signed)
History     CSN: [id number]  Arrival date and time: 10/27/18 1640   First Provider Initiated Contact with Patient 10/27/18 1717      Chief Complaint  Patient presents with  . Nausea  . Emesis   HPI Alice Frye is a 28 y.o. [phone] at 7257w3d who presents to MAU with chief complaint of recurrent nausea and vomiting. Patient estimates she has vomited 7 times today. She was last able to tolerate a ham and cheese sandwich yesterday for breakfast. She attempted to eat [place] sausages and beans for breakfast this morning but vomited almost immediately. Patient was given OTC B6 and "something chewable that starts with an M" by her pharmacist. She did not experience relief. She denies abdominal pain, vaginal bleeding, urinary symptoms, fever or recent illness.    [facility]  OB History    Gravida  4   Para  2   Term  2   Preterm      AB  1   Living  2     SAB      TAB  1   Ectopic      Multiple  0   Live Births  2           Past Medical History:  Diagnosis Date  . Medical history non-contributory     Past Surgical History:  Procedure Laterality Date  . CESAREAN SECTION      Family History  Problem Relation Age of Onset  . Diabetes Mother   . Hypertension Mother   . Diabetes Father   . Diabetes Sister   . Heart disease Neg Hx   . Stroke Neg Hx   . Alcohol abuse Neg Hx     Social History   Tobacco Use  . Smoking status: Never Smoker  . Smokeless tobacco: Never Used  Substance Use Topics  . Alcohol use: Not Currently    Comment: occ  . Drug use: No    Allergies: No Known Allergies  Medications Prior to Admission  Medication Sig Dispense Refill Last Dose  . ibuprofen (ADVIL,MOTRIN) 200 MG tablet Take 400 mg by mouth every 6 (six) hours as needed for headache or moderate pain.    Unknown at Unknown time  . Lactobacillus (ACIDOPHILUS PROBIOTIC) 10 MG TABS Take 10 mg by mouth 3 (three) times daily. (Patient not  taking: Reported on 07/03/2016) 30 tablet 0 Unknown at Unknown time  . penicillin v potassium (VEETID) 500 MG tablet Take 1 tablet (500 mg total) by mouth 4 (four) times daily. (Patient not taking: Reported on 07/03/2016) 28 tablet 0 Unknown at Unknown time    Review of Systems  Constitutional: Negative for chills, fatigue and fever.  Gastrointestinal: Positive for nausea and vomiting. Negative for abdominal pain.  Genitourinary: Negative for difficulty urinating, dyspareunia, dysuria, flank pain, vaginal bleeding, vaginal discharge and vaginal pain.  Musculoskeletal: Negative for back pain.  Neurological: Negative for dizziness, syncope, light-headedness and headaches.  All other systems reviewed and are negative.  Physical Exam   Blood pressure 127/74, pulse 86, temperature 98.6 F (37 C), temperature source Oral, resp. rate 18, height 5' (1.524 m), weight 111.3 kg, last menstrual period 08/22/2018, SpO2 99 %, unknown if currently breastfeeding.  Physical Exam  Nursing note and vitals reviewed. Constitutional: She is oriented to person, place, and time. She appears well-developed and well-nourished.  Cardiovascular: Normal rate.  Respiratory: Effort normal.  GI: Soft. Bowel sounds are normal. She  exhibits no distension. There is no abdominal tenderness. There is no rigidity, no rebound, no guarding and no CVA tenderness.  Neurological: She is alert and oriented to person, place, and time.  Skin: Skin is warm and dry.  Psychiatric: She has a normal mood and affect. Her behavior is normal. Judgment and thought content normal.    MAU Course  Procedures  --Mild ketonuria. Patient intermittently tolerating PO. Discussed altering her diet as primary intervention.  Patient Vitals for the past 24 hrs:  BP Temp Temp src Pulse Resp SpO2 Height Weight  10/27/18 1746 126/72 - - 83 18 99 % - -  10/27/18 1704 127/74 98.6 F (37 C) Oral 86 18 99 % 5' (1.524 m) 111.3 kg    Results for orders  placed or performed during the hospital encounter of 10/27/18 (from the past 24 hour(s))  Urinalysis, Routine w reflex microscopic     Status: Abnormal   Collection Time: 10/27/18  4:58 PM  Result Value Ref Range   Color, Urine YELLOW YELLOW   APPearance HAZY (A) CLEAR   Specific Gravity, Urine 1.023 1.005 - 1.030   pH 6.0 5.0 - 8.0   Glucose, UA NEGATIVE NEGATIVE mg/dL   Hgb urine dipstick NEGATIVE NEGATIVE   Bilirubin Urine NEGATIVE NEGATIVE   Ketones, ur 20 (A) NEGATIVE mg/dL   Protein, ur NEGATIVE NEGATIVE mg/dL   Nitrite NEGATIVE NEGATIVE   Leukocytes,Ua TRACE (A) NEGATIVE   RBC / HPF 0-5 0 - 5 RBC/hpf   WBC, UA 0-5 0 - 5 WBC/hpf   Bacteria, UA NONE SEEN NONE SEEN   Squamous Epithelial / LPF 0-5 0 - 5   Mucus PRESENT   Pregnancy, urine POC     Status: Abnormal   Collection Time: 10/27/18  5:00 PM  Result Value Ref Range   Preg Test, Ur POSITIVE (A) NEGATIVE    Meds ordered this encounter  Medications  . metoCLOPramide (REGLAN) 10 MG tablet    Sig: Take 1 tablet (10 mg total) by mouth every 6 (six) hours. Use for management of nausea and vomiting during the day    Dispense:  30 tablet    Refill:  0    Order Specific Question:   Supervising Provider    Answer:   Reva Bores [[id number]]  . promethazine (PHENERGAN) 25 MG tablet    Sig: Take 1 tablet (25 mg total) by mouth every 6 (six) hours as needed for nausea or vomiting. Use in the evening and overnight as needed    Dispense:  30 tablet    Refill:  0    Order Specific Question:   Supervising Provider    Answer:   Reva Bores [[id number]]   Assessment and Plan  --28 y.o. [id number] at [redacted]w[redacted]d  --N/V, rx to pharmacy.  --Focus on bland, solids, slow pace for meals. Limit sugar, strong flavors, fried foods --Discharge home in stable condition  F/U: Patient has New Ob appt at [facility] this Thursday 10/31/18  Calvert Cantor, CNM 10/27/2018, 6:13 PM

## 2018-10-27 NOTE — Discharge Instructions (Signed)
Nausea and Vomiting in Early Pregnancy What are the causes? The cause of this condition is not known. It may be related to changes in chemicals (hormones) in the body during pregnancy, such as the high level of pregnancy hormone (human chorionic gonadotropin) or the increase in the female sex hormone (estrogen). What are the signs or symptoms? Symptoms of this condition include:  Nausea that does not go away.  Vomiting that does not allow you to keep any food down.  Weight loss.  Body fluid loss (dehydration).  Having no desire to eat, or not liking food that you have previously enjoyed. How is this diagnosed? This condition may be diagnosed based on:  A physical exam.  Your medical history.  Your symptoms.  Blood tests.  Urine tests. How is this treated? This condition is managed by controlling symptoms. This may include:  Following an eating plan. This can help lessen nausea and vomiting.  Taking prescription medicines. An eating plan and medicines are often used together to help control symptoms. If medicines do not help relieve nausea and vomiting, you may need to receive fluids through an IV at the hospital. Follow these instructions at home: Eating and drinking   Avoid the following: ? Drinking fluids with meals. Try not to drink anything during the 30 minutes before and after your meals. ? Drinking more than 1 cup of fluid at a time. ? Eating foods that trigger your symptoms. These may include spicy foods, coffee, high-fat foods, very sweet foods, and acidic foods. ? Skipping meals. Nausea can be more intense on an empty stomach. If you cannot tolerate food, do not force it. Try sucking on ice chips or other frozen items and make up for missed calories later. ? Lying down within 2 hours after eating. ? Being exposed to environmental triggers. These may include food smells, smoky rooms, closed spaces, rooms with strong smells, warm or humid places, overly loud and  noisy rooms, and rooms with motion or flickering lights. Try eating meals in a well-ventilated area that is free of strong smells. ? Quick and sudden changes in your movement. ? Taking iron pills and multivitamins that contain iron. If you take prescription iron pills, do not stop taking them unless your health care provider approves. ? Preparing food. The smell of food can spoil your appetite or trigger nausea.  To help relieve your symptoms: ? Listen to your body. Everyone is different and has different preferences. Find what works best for you. ? Eat and drink slowly. ? Eat 5-6 small meals daily instead of 3 large meals. Eating small meals and snacks can help you avoid an empty stomach. ? In the morning, before getting out of bed, eat a couple of crackers to avoid moving around on an empty stomach. ? Try eating starchy foods as these are usually tolerated well. Examples include cereal, toast, bread, potatoes, pasta, rice, and pretzels. ? Include at least 1 serving of protein with your meals and snacks. Protein options include lean meats, poultry, seafood, beans, nuts, nut butters, eggs, cheese, and yogurt. ? Try eating a protein-rich snack before bed. Examples of a protein-rick snack include cheese and crackers or a peanut butter sandwich made with 1 slice of whole-wheat bread and 1 tsp (5 g) of peanut butter. ? Eat or suck on things that have ginger in them. It may help relieve nausea. Add  tsp ground ginger to hot tea or choose ginger tea. ? Try drinking 100% fruit juice or an electrolyte  drink. An electrolyte drink contains sodium, potassium, and chloride. ? Drink fluids that are cold, clear, and carbonated or sour. Examples include lemonade, ginger ale, lemon-lime soda, ice water, and sparkling water. ? Brush your teeth or use a mouth rinse after meals. ? Talk with your health care provider about starting a supplement of vitamin B6. General instructions  Take over-the-counter and  prescription medicines only as told by your health care provider.  Follow instructions from your health care provider about eating or drinking restrictions.  Continue to take your prenatal vitamins as told by your health care provider. If you are having trouble taking your prenatal vitamins, talk with your health care provider about different options.  Keep all follow-up and pre-birth (prenatal) visits as told by your health care provider. This is important. Contact a health care provider if:  You have pain in your abdomen.  You have a severe headache.  You have vision problems.  You are losing weight.  You feel weak or dizzy. Get help right away if:  You cannot drink fluids without vomiting.  You vomit blood.  You have constant nausea and vomiting.  You are very weak.  You faint.  You have a fever and your symptoms suddenly get worse. Summary  Making some changes to your eating habits may help relieve nausea and vomiting.  This condition may be managed with medicine.  If medicines do not help relieve nausea and vomiting, you may need to receive fluids through an IV at the hospital. This information is not intended to replace advice given to you by your health care provider. Make sure you discuss any questions you have with your health care provider. Document Released: 05/29/2005 Document Revised: 06/18/2017 Document Reviewed: 01/26/2016 Elsevier Interactive Patient Education  2019 [employer].

## 2018-10-31 LAB — OB RESULTS CONSOLE RUBELLA ANTIBODY, IGM: Rubella: IMMUNE

## 2018-10-31 LAB — OB RESULTS CONSOLE ANTIBODY SCREEN: Antibody Screen: NEGATIVE

## 2018-10-31 LAB — OB RESULTS CONSOLE HIV ANTIBODY (ROUTINE TESTING): HIV: NONREACTIVE

## 2018-10-31 LAB — OB RESULTS CONSOLE RPR: RPR: NONREACTIVE

## 2018-10-31 LAB — OB RESULTS CONSOLE GC/CHLAMYDIA
Chlamydia: NEGATIVE
Gonorrhea: NEGATIVE

## 2018-10-31 LAB — OB RESULTS CONSOLE HEPATITIS B SURFACE ANTIGEN: Hepatitis B Surface Ag: NEGATIVE

## 2018-10-31 LAB — OB RESULTS CONSOLE ABO/RH: RH Type: NEGATIVE

## 2018-11-01 ENCOUNTER — Other Ambulatory Visit (HOSPITAL_COMMUNITY): Payer: Self-pay | Admitting: Family

## 2018-11-01 DIAGNOSIS — Z3682 Encounter for antenatal screening for nuchal translucency: Secondary | ICD-10-CM

## 2018-11-01 DIAGNOSIS — Z3A13 13 weeks gestation of pregnancy: Secondary | ICD-10-CM

## 2018-11-20 ENCOUNTER — Telehealth: Payer: Self-pay | Admitting: Family Medicine

## 2018-11-20 NOTE — Telephone Encounter (Signed)
Spoke with patient about her lab appointment on 6/11 @ 10:00. Patient instructed to wear a face mask for the entire visit and no visitors will be allowed with her. Patient was screened for the covid symptoms and denied any symptoms.

## 2018-11-21 ENCOUNTER — Ambulatory Visit (HOSPITAL_COMMUNITY)
Admission: RE | Admit: 2018-11-21 | Discharge: 2018-11-21 | Disposition: A | Discharge status: Home / Self Care | Payer: Medicaid Other | Source: Ambulatory Visit | Attending: Obstetrics and Gynecology | Admitting: Obstetrics and Gynecology

## 2018-11-21 ENCOUNTER — Ambulatory Visit (HOSPITAL_COMMUNITY): Payer: Medicaid Other

## 2018-11-21 ENCOUNTER — Ambulatory Visit (HOSPITAL_COMMUNITY): Payer: Medicaid Other | Admitting: *Deleted

## 2018-11-21 ENCOUNTER — Other Ambulatory Visit: Payer: Self-pay

## 2018-11-21 ENCOUNTER — Encounter (HOSPITAL_COMMUNITY): Payer: Self-pay

## 2018-11-21 ENCOUNTER — Other Ambulatory Visit: Payer: Medicaid Other

## 2018-11-21 VITALS — BP 106/59 | HR 100 | Temp 98.9°F | Wt 242.0 lb

## 2018-11-21 DIAGNOSIS — Z3682 Encounter for antenatal screening for nuchal translucency: Secondary | ICD-10-CM

## 2018-11-21 DIAGNOSIS — Z3A13 13 weeks gestation of pregnancy: Secondary | ICD-10-CM | POA: Insufficient documentation

## 2018-11-21 DIAGNOSIS — Z369 Encounter for antenatal screening, unspecified: Secondary | ICD-10-CM

## 2018-11-23 LAB — FIRST TRIMESTER SCREEN W/NT
CRL: 67.3 mm
DIA MoM: 1.01
DIA Value: 167.9 pg/mL
Gest Age-Collect: 12.9 weeks
Maternal Age At EDD: 30.3 yr
Nuchal Translucency MoM: 1.45
Nuchal Translucency: 2.2 mm
Number of Fetuses: 1
PAPP-A MoM: 0.85
PAPP-A Value: 559.8 ng/mL
Test Results:: NEGATIVE
Weight: 242 [lb_av]
hCG MoM: 1.57
hCG Value: 102.2 IU/mL

## 2019-05-01 LAB — OB RESULTS CONSOLE GBS: GBS: POSITIVE

## 2019-05-02 ENCOUNTER — Other Ambulatory Visit: Payer: Self-pay

## 2019-05-02 ENCOUNTER — Encounter (HOSPITAL_COMMUNITY): Payer: Self-pay | Admitting: *Deleted

## 2019-05-02 ENCOUNTER — Inpatient Hospital Stay (HOSPITAL_COMMUNITY)
Admission: AD | Admit: 2019-05-02 | Discharge: 2019-05-02 | Disposition: A | Discharge status: Home / Self Care | Payer: Medicaid Other | Attending: Obstetrics & Gynecology | Admitting: Obstetrics & Gynecology

## 2019-05-02 DIAGNOSIS — O26853 Spotting complicating pregnancy, third trimester: Secondary | ICD-10-CM | POA: Insufficient documentation

## 2019-05-02 DIAGNOSIS — Z3A36 36 weeks gestation of pregnancy: Secondary | ICD-10-CM | POA: Insufficient documentation

## 2019-05-02 DIAGNOSIS — Z3689 Encounter for other specified antenatal screening: Secondary | ICD-10-CM | POA: Insufficient documentation

## 2019-05-02 LAB — WET PREP, GENITAL
Clue Cells Wet Prep HPF POC: NONE SEEN
Sperm: NONE SEEN
Trich, Wet Prep: NONE SEEN
Yeast Wet Prep HPF POC: NONE SEEN

## 2019-05-02 LAB — URINALYSIS, ROUTINE W REFLEX MICROSCOPIC
Bacteria, UA: NONE SEEN
Bilirubin Urine: NEGATIVE
Glucose, UA: NEGATIVE mg/dL
Ketones, ur: 5 mg/dL — AB
Leukocytes,Ua: NEGATIVE
Nitrite: NEGATIVE
Protein, ur: NEGATIVE mg/dL
Specific Gravity, Urine: 1.013 (ref 1.005–1.030)
pH: 8 (ref 5.0–8.0)

## 2019-05-02 NOTE — Discharge Instructions (Signed)

## 2019-05-02 NOTE — MAU Provider Note (Signed)
History     CSN: [id number]  Arrival date and time: 05/02/19 2423   First Provider Initiated Contact with Patient 05/02/19 0902      Chief Complaint  Patient presents with  . Vaginal Bleeding   HPI Alice Frye is a 28 y.o. [id number] at [redacted]w[redacted]d who presents to MAU with chief complaint of vaginal spotting when she wiped after voiding earlier today. She endorses seeing a small clot at that time. She denies continuous or heavy vaginal bleeding, LOF, DFM, contraction pain, or recent illness.  Patient receives [facility] with [facility]. She states she was seen in clinic yesterday and her cervix was closed on exam.  For TOLAC. Consent signed 04/22/2019 and visible in chart. OB hist: primary c/s for chorio 2008, successful vbac 2016. Both labors started with SROM at home  OB History    Gravida  4   Para  2   Term  2   Preterm      AB  1   Living  2     SAB      TAB  1   Ectopic      Multiple  0   Live Births  2           Past Medical History:  Diagnosis Date  . Medical history non-contributory     Past Surgical History:  Procedure Laterality Date  . CESAREAN SECTION      Family History  Problem Relation Age of Onset  . Diabetes Mother   . Hypertension Mother   . Diabetes Father   . Diabetes Sister   . Heart disease Neg Hx   . Stroke Neg Hx   . Alcohol abuse Neg Hx     Social History   Tobacco Use  . Smoking status: Never Smoker  . Smokeless tobacco: Never Used  Substance Use Topics  . Alcohol use: Not Currently    Comment: occ  . Drug use: No    Allergies: No Known Allergies  Medications Prior to Admission  Medication Sig Dispense Refill Last Dose  . Prenatal Vit-Fe Fumarate-FA (PREPLUS) 27-1 MG TABS Take by mouth.   05/02/2019 at Unknown time  . metoCLOPramide (REGLAN) 10 MG tablet Take 1 tablet (10 mg total) by mouth every 6 (six) hours. Use for management of nausea and vomiting during the day (Patient not taking: Reported on 11/21/2018) 30 tablet  0   . promethazine (PHENERGAN) 25 MG tablet Take 1 tablet (25 mg total) by mouth every 6 (six) hours as needed for nausea or vomiting. Use in the evening and overnight as needed 30 tablet 0     Review of Systems  Constitutional: Negative for chills, fatigue and fever.  Respiratory: Negative for shortness of breath.   Gastrointestinal: Negative for abdominal pain.  Genitourinary: Positive for vaginal bleeding. Negative for vaginal discharge and vaginal pain.  Musculoskeletal: Negative for back pain.  All other systems reviewed and are negative.  Physical Exam   Blood pressure (!) 117/57, pulse 100, temperature 98.8 F (37.1 C), temperature source Oral, resp. rate 18, height 5' (1.524 m), weight 113.4 kg, last menstrual period 08/22/2018, SpO2 98 %, unknown if currently breastfeeding.  Physical Exam  Nursing note and vitals reviewed. Constitutional: She is oriented to person, place, and time. She appears well-developed and well-nourished.  Cardiovascular: Normal rate.  Respiratory: Effort normal and breath sounds normal.  GI: She exhibits no distension. There is no abdominal tenderness. There is no rebound and no guarding.  Gravid  Genitourinary:    Uterus normal.     Vaginal discharge present.     Genitourinary Comments: Scant brown-tinged discharge removed with fox swab x 1. No active bleeding, injury, no CMT.    Neurological: She is alert and oriented to person, place, and time.  Skin: Skin is warm and dry.  Psychiatric: She has a normal mood and affect. Her behavior is normal. Judgment and thought content normal.    MAU Course/MDM  Procedures  --Cervix now 1cm on initial exam and after one hour of observation in MAU. Discussed with patient that she may have experienced bleeding after cervical exam which is normal --Reactive tracing: baseline 145, mod variability, pos 15 x 15 accels, no decels --Toco: quiet --Pain score 0/10 throughout time in MAU  Patient Vitals for the  past 24 hrs:  BP Temp Temp src Pulse Resp SpO2 Height Weight  05/02/19 1016 (!) 108/52 - - 93 16 - - -  05/02/19 0900 (!) 117/57 - - 100 - 98 % - -  05/02/19 0833 118/69 98.8 F (37.1 C) Oral 97 18 99 % 5' (1.524 m) 113.4 kg    Results for orders placed or performed during the hospital encounter of 05/02/19 (from the past 24 hour(s))  Urinalysis, Routine w reflex microscopic     Status: Abnormal   Collection Time: 05/02/19  8:44 AM  Result Value Ref Range   Color, Urine YELLOW YELLOW   APPearance CLEAR CLEAR   Specific Gravity, Urine 1.013 1.005 - 1.030   pH 8.0 5.0 - 8.0   Glucose, UA NEGATIVE NEGATIVE mg/dL   Hgb urine dipstick LARGE (A) NEGATIVE   Bilirubin Urine NEGATIVE NEGATIVE   Ketones, ur 5 (A) NEGATIVE mg/dL   Protein, ur NEGATIVE NEGATIVE mg/dL   Nitrite NEGATIVE NEGATIVE   Leukocytes,Ua NEGATIVE NEGATIVE   RBC / HPF 0-5 0 - 5 RBC/hpf   WBC, UA 0-5 0 - 5 WBC/hpf   Bacteria, UA NONE SEEN NONE SEEN   Squamous Epithelial / LPF 0-5 0 - 5   Mucus PRESENT   Wet prep, genital     Status: Abnormal   Collection Time: 05/02/19  9:11 AM   Specimen: PATH Cytology Cervicovaginal Ancillary Only  Result Value Ref Range   Yeast Wet Prep HPF POC NONE SEEN NONE SEEN   Trich, Wet Prep NONE SEEN NONE SEEN   Clue Cells Wet Prep HPF POC NONE SEEN NONE SEEN   WBC, Wet Prep HPF POC MANY (A) NONE SEEN   Sperm NONE SEEN    Assessment and Plan  --28 y.o. [id number] at [redacted]w[redacted]d  --Reactive tracing --Remains 1cm/thick/posterior after one hour observation in MAU --Spotting likely result of cervical exam in clinic yesterday, cervical change from closed to 1cm --Denies pain throughout time in MAU --Discharge home in stable condition  F/U: --Next [facility] appt is Wednesday 05/07/19  Calvert Cantor , CNM 05/02/2019, 1:29 PM

## 2019-05-02 NOTE — MAU Note (Addendum)
Pt is G4P2, [redacted]w[redacted]D.  She woke up this morning and saw "a lot " of blood when she wiped. Also saw a small clot. She is wearing a pad. Unsure how much blood. No recent intercourse. Had BV a month ago.  Had had doctor's appointment yesterday at [facility]  and had SVE and was closed. Denies LOF or pain. +FM.

## 2019-05-05 LAB — GC/CHLAMYDIA PROBE AMP (~~LOC~~) NOT AT ARMC
Chlamydia: NEGATIVE
Comment: NEGATIVE
Comment: NORMAL
Neisseria Gonorrhea: NEGATIVE

## 2019-05-23 ENCOUNTER — Inpatient Hospital Stay (HOSPITAL_COMMUNITY): Payer: Medicaid Other | Admitting: Anesthesiology

## 2019-05-23 ENCOUNTER — Other Ambulatory Visit: Payer: Self-pay

## 2019-05-23 ENCOUNTER — Encounter (HOSPITAL_COMMUNITY): Payer: Self-pay | Admitting: Obstetrics & Gynecology

## 2019-05-23 ENCOUNTER — Inpatient Hospital Stay (HOSPITAL_COMMUNITY)
Admission: AD | Admit: 2019-05-23 | Discharge: 2019-05-25 | DRG: 807 | Disposition: A | Discharge status: Home / Self Care | Payer: Medicaid Other | Attending: Obstetrics & Gynecology | Admitting: Obstetrics & Gynecology

## 2019-05-23 DIAGNOSIS — Z6791 Unspecified blood type, Rh negative: Secondary | ICD-10-CM

## 2019-05-23 DIAGNOSIS — O4290 Premature rupture of membranes, unspecified as to length of time between rupture and onset of labor, unspecified weeks of gestation: Secondary | ICD-10-CM | POA: Diagnosis present

## 2019-05-23 DIAGNOSIS — Z3A39 39 weeks gestation of pregnancy: Secondary | ICD-10-CM

## 2019-05-23 DIAGNOSIS — O99824 Streptococcus B carrier state complicating childbirth: Secondary | ICD-10-CM | POA: Diagnosis present

## 2019-05-23 DIAGNOSIS — Z20828 Contact with and (suspected) exposure to other viral communicable diseases: Secondary | ICD-10-CM | POA: Diagnosis present

## 2019-05-23 DIAGNOSIS — Z975 Presence of (intrauterine) contraceptive device: Secondary | ICD-10-CM

## 2019-05-23 DIAGNOSIS — O99214 Obesity complicating childbirth: Secondary | ICD-10-CM | POA: Diagnosis present

## 2019-05-23 DIAGNOSIS — O4292 Full-term premature rupture of membranes, unspecified as to length of time between rupture and onset of labor: Principal | ICD-10-CM | POA: Diagnosis present

## 2019-05-23 DIAGNOSIS — Z30017 Encounter for initial prescription of implantable subdermal contraceptive: Secondary | ICD-10-CM | POA: Diagnosis not present

## 2019-05-23 DIAGNOSIS — O34219 Maternal care for unspecified type scar from previous cesarean delivery: Secondary | ICD-10-CM | POA: Diagnosis present

## 2019-05-23 DIAGNOSIS — O360130 Maternal care for anti-D [Rh] antibodies, third trimester, not applicable or unspecified: Secondary | ICD-10-CM

## 2019-05-23 DIAGNOSIS — O26893 Other specified pregnancy related conditions, third trimester: Secondary | ICD-10-CM | POA: Diagnosis present

## 2019-05-23 DIAGNOSIS — Z98891 History of uterine scar from previous surgery: Secondary | ICD-10-CM

## 2019-05-23 DIAGNOSIS — E669 Obesity, unspecified: Secondary | ICD-10-CM | POA: Diagnosis present

## 2019-05-23 DIAGNOSIS — O4202 Full-term premature rupture of membranes, onset of labor within 24 hours of rupture: Secondary | ICD-10-CM

## 2019-05-23 LAB — CBC
HCT: 31.2 % — ABNORMAL LOW (ref 36.0–46.0)
Hemoglobin: 9.9 g/dL — ABNORMAL LOW (ref 12.0–15.0)
MCH: 26.5 pg (ref 26.0–34.0)
MCHC: 31.7 g/dL (ref 30.0–36.0)
MCV: 83.4 fL (ref 80.0–100.0)
Platelets: 366 10*3/uL (ref 150–400)
RBC: 3.74 MIL/uL — ABNORMAL LOW (ref 3.87–5.11)
RDW: 16 % — ABNORMAL HIGH (ref 11.5–15.5)
WBC: 9.3 10*3/uL (ref 4.0–10.5)
nRBC: 0 % (ref 0.0–0.2)

## 2019-05-23 LAB — URINALYSIS, ROUTINE W REFLEX MICROSCOPIC
Bilirubin Urine: NEGATIVE
Glucose, UA: NEGATIVE mg/dL
Ketones, ur: NEGATIVE mg/dL
Leukocytes,Ua: NEGATIVE
Nitrite: NEGATIVE
Protein, ur: NEGATIVE mg/dL
Specific Gravity, Urine: 1.023 (ref 1.005–1.030)
pH: 6 (ref 5.0–8.0)

## 2019-05-23 LAB — POCT FERN TEST: POCT Fern Test: POSITIVE

## 2019-05-23 LAB — RPR: RPR Ser Ql: NONREACTIVE

## 2019-05-23 LAB — SARS CORONAVIRUS 2 (TAT 6-24 HRS): SARS Coronavirus 2: NEGATIVE

## 2019-05-23 LAB — ABO/RH: ABO/RH(D): B NEG

## 2019-05-23 MED ORDER — LACTATED RINGERS IV SOLN: 500.0000 mL | INTRAVENOUS | Status: DC | PRN

## 2019-05-23 MED ORDER — BENZOCAINE-MENTHOL 20-0.5 % EX AERO: 1.0000 "application " | INHALATION_SPRAY | CUTANEOUS | Status: DC | PRN

## 2019-05-23 MED ORDER — OXYCODONE-ACETAMINOPHEN 5-325 MG PO TABS: 1.0000 | ORAL_TABLET | ORAL | Status: DC | PRN

## 2019-05-23 MED ORDER — PENICILLIN G POT IN DEXTROSE 60000 UNIT/ML IV SOLN
3.0000 10*6.[IU] | INTRAVENOUS | Status: DC
  Administered 2019-05-23: 3 10*6.[IU] via INTRAVENOUS
  Filled 2019-05-23 (×2): qty 50

## 2019-05-23 MED ORDER — LACTATED RINGERS IV SOLN: 500.0000 mL | Freq: Once | INTRAVENOUS | Status: DC

## 2019-05-23 MED ORDER — LIDOCAINE HCL (PF) 1 % IJ SOLN
30.0000 mL | INTRAMUSCULAR | Status: AC | PRN
  Administered 2019-05-23 (×2): 4 mL via SUBCUTANEOUS

## 2019-05-23 MED ORDER — DIPHENHYDRAMINE HCL 25 MG PO CAPS: 25.0000 mg | ORAL_CAPSULE | Freq: Four times a day (QID) | ORAL | Status: DC | PRN

## 2019-05-23 MED ORDER — WITCH HAZEL-GLYCERIN EX PADS: 1.0000 "application " | MEDICATED_PAD | CUTANEOUS | Status: DC | PRN

## 2019-05-23 MED ORDER — PHENYLEPHRINE 40 MCG/ML (10ML) SYRINGE FOR IV PUSH (FOR BLOOD PRESSURE SUPPORT)
80.0000 ug | PREFILLED_SYRINGE | INTRAVENOUS | Status: DC | PRN
  Filled 2019-05-23: qty 10

## 2019-05-23 MED ORDER — ONDANSETRON HCL 4 MG PO TABS: 4.0000 mg | ORAL_TABLET | ORAL | Status: DC | PRN

## 2019-05-23 MED ORDER — IBUPROFEN 600 MG PO TABS
600.0000 mg | ORAL_TABLET | Freq: Four times a day (QID) | ORAL | Status: DC
  Administered 2019-05-23 – 2019-05-25 (×8): 600 mg via ORAL
  Filled 2019-05-23 (×8): qty 1

## 2019-05-23 MED ORDER — TETANUS-DIPHTH-ACELL PERTUSSIS 5-2.5-18.5 LF-MCG/0.5 IM SUSP: 0.5000 mL | Freq: Once | INTRAMUSCULAR | Status: DC

## 2019-05-23 MED ORDER — ACETAMINOPHEN 325 MG PO TABS: 650.0000 mg | ORAL_TABLET | ORAL | Status: DC | PRN

## 2019-05-23 MED ORDER — SOD CITRATE-CITRIC ACID 500-334 MG/5ML PO SOLN: 30.0000 mL | ORAL | Status: DC | PRN

## 2019-05-23 MED ORDER — FENTANYL CITRATE (PF) 100 MCG/2ML IJ SOLN
INTRAMUSCULAR | Status: AC
  Filled 2019-05-23: qty 2

## 2019-05-23 MED ORDER — PHENYLEPHRINE 40 MCG/ML (10ML) SYRINGE FOR IV PUSH (FOR BLOOD PRESSURE SUPPORT)
80.0000 ug | PREFILLED_SYRINGE | INTRAVENOUS | Status: DC | PRN
  Administered 2019-05-23: 80 ug via INTRAVENOUS

## 2019-05-23 MED ORDER — SODIUM CHLORIDE 0.9 % IV SOLN
5.0000 10*6.[IU] | Freq: Once | INTRAVENOUS | Status: AC
  Administered 2019-05-23: 5 10*6.[IU] via INTRAVENOUS
  Filled 2019-05-23: qty 5

## 2019-05-23 MED ORDER — OXYTOCIN 40 UNITS IN NORMAL SALINE INFUSION - SIMPLE MED
2.5000 [IU]/h | INTRAVENOUS | Status: DC
  Filled 2019-05-23: qty 1000

## 2019-05-23 MED ORDER — DIBUCAINE (PERIANAL) 1 % EX OINT: 1.0000 "application " | TOPICAL_OINTMENT | CUTANEOUS | Status: DC | PRN

## 2019-05-23 MED ORDER — CYCLOBENZAPRINE HCL 5 MG PO TABS
10.0000 mg | ORAL_TABLET | Freq: Once | ORAL | Status: AC
  Administered 2019-05-23: 16:00:00 10 mg via ORAL
  Filled 2019-05-23: qty 2

## 2019-05-23 MED ORDER — LACTATED RINGERS IV SOLN
500.0000 mL | Freq: Once | INTRAVENOUS | Status: AC
  Administered 2019-05-23: 500 mL via INTRAVENOUS

## 2019-05-23 MED ORDER — ACETAMINOPHEN 325 MG PO TABS
650.0000 mg | ORAL_TABLET | ORAL | Status: DC | PRN
  Administered 2019-05-23 – 2019-05-25 (×5): 650 mg via ORAL
  Filled 2019-05-23 (×5): qty 2

## 2019-05-23 MED ORDER — OXYCODONE-ACETAMINOPHEN 5-325 MG PO TABS: 2.0000 | ORAL_TABLET | ORAL | Status: DC | PRN

## 2019-05-23 MED ORDER — OXYTOCIN BOLUS FROM INFUSION
500.0000 mL | Freq: Once | INTRAVENOUS | Status: AC
  Administered 2019-05-23: 500 mL via INTRAVENOUS

## 2019-05-23 MED ORDER — ONDANSETRON HCL 4 MG/2ML IJ SOLN: 4.0000 mg | INTRAMUSCULAR | Status: DC | PRN

## 2019-05-23 MED ORDER — FENTANYL-BUPIVACAINE-NACL 0.5-0.125-0.9 MG/250ML-% EP SOLN
12.0000 mL/h | EPIDURAL | Status: DC | PRN
  Filled 2019-05-23: qty 250

## 2019-05-23 MED ORDER — SIMETHICONE 80 MG PO CHEW: 80.0000 mg | CHEWABLE_TABLET | ORAL | Status: DC | PRN

## 2019-05-23 MED ORDER — DIPHENHYDRAMINE HCL 50 MG/ML IJ SOLN: 12.5000 mg | INTRAMUSCULAR | Status: DC | PRN

## 2019-05-23 MED ORDER — COCONUT OIL OIL: 1.0000 "application " | TOPICAL_OIL | Status: DC | PRN

## 2019-05-23 MED ORDER — FENTANYL CITRATE (PF) 100 MCG/2ML IJ SOLN
100.0000 ug | INTRAMUSCULAR | Status: DC | PRN
  Administered 2019-05-23: 100 ug via INTRAVENOUS

## 2019-05-23 MED ORDER — LACTATED RINGERS IV SOLN
INTRAVENOUS | Status: DC
  Administered 2019-05-23 (×3): via INTRAVENOUS

## 2019-05-23 MED ORDER — EPHEDRINE 5 MG/ML INJ: 10.0000 mg | INTRAVENOUS | Status: DC | PRN

## 2019-05-23 MED ORDER — PRENATAL MULTIVITAMIN CH
1.0000 | ORAL_TABLET | Freq: Every day | ORAL | Status: DC
  Administered 2019-05-24 – 2019-05-25 (×2): 1 via ORAL
  Filled 2019-05-23 (×2): qty 1

## 2019-05-23 MED ORDER — SODIUM CHLORIDE (PF) 0.9 % IJ SOLN
INTRAMUSCULAR | Status: DC | PRN
  Administered 2019-05-23: 10 mL/h via EPIDURAL

## 2019-05-23 MED ORDER — ONDANSETRON HCL 4 MG/2ML IJ SOLN: 4.0000 mg | Freq: Four times a day (QID) | INTRAMUSCULAR | Status: DC | PRN

## 2019-05-23 MED ORDER — SENNOSIDES-DOCUSATE SODIUM 8.6-50 MG PO TABS
2.0000 | ORAL_TABLET | ORAL | Status: DC
  Administered 2019-05-23 – 2019-05-24 (×2): 2 via ORAL
  Filled 2019-05-23 (×2): qty 2

## 2019-05-23 NOTE — Anesthesia Procedure Notes (Signed)
Epidural Patient location during procedure: OB Start time: 05/23/2019 9:45 AM End time: 05/23/2019 9:54 AM  Staffing Anesthesiologist: Josephine Igo, MD Performed: anesthesiologist   Preanesthetic Checklist Completed: patient identified, IV checked, site marked, risks and benefits discussed, surgical consent, monitors and equipment checked, pre-op evaluation and timeout performed  Epidural Patient position: sitting Prep: DuraPrep and site prepped and draped Patient monitoring: continuous pulse ox and blood pressure Approach: midline Location: L4-L5 Injection technique: LOR air  Needle:  Needle type: Tuohy  Needle gauge: 17 G Needle length: 9 cm and 9 Needle insertion depth: 8 cm Catheter type: closed end flexible Catheter size: 19 Gauge Catheter at skin depth: 13 cm Test dose: negative and Other  Assessment Events: blood not aspirated, injection not painful, no injection resistance, no paresthesia and negative IV test  Additional Notes Patient identified. Risks and benefits discussed including failed block, incomplete  Pain control, post dural puncture headache, nerve damage, paralysis, blood pressure Changes, nausea, vomiting, reactions to medications-both toxic and allergic and post Partum back pain. All questions were answered. Patient expressed understanding and wished to proceed. Sterile technique was used throughout procedure. Epidural site was Dressed with sterile barrier dressing. No paresthesias, signs of intravascular injection Or signs of intrathecal spread were encountered.  Patient was more comfortable after the epidural was dosed. Please see RN's note for documentation of vital signs and FHR which are stable. Reason for block:procedure for pain

## 2019-05-23 NOTE — Discharge Summary (Signed)
Postpartum Discharge Summary      Patient Name: Alice Frye DOB: 1990/06/27 MRN: [id number]  Date of admission: 05/23/2019 Delivering Provider: Chauncey Mann   Date of discharge: 05/25/2019  Admitting diagnosis: PROM (premature rupture of membranes) [O42.90] Intrauterine pregnancy: [redacted]w[redacted]d    Secondary diagnosis:  Active Problems:   OBESITY   Ruptured, membranes, premature   PROM (premature rupture of membranes)   History of cesarean section   History of VBAC   VBAC (vaginal birth after Cesarean)   Nexplanon in place  Additional problems: None     Discharge diagnosis: Term Pregnancy Delivered                                                                                                Post partum procedures:none  Augmentation: None  Complications: None  Hospital course:  Onset of Labor With Vaginal Delivery     28y.o. yo [id number] 326w1das admitted in Latent Labor on 05/23/2019. Patient had an uncomplicated labor course as follows: Patient presented to L&D for SOL/SROM. Initial SVE: 4/90/-1. Patient received epidural and progressed to complete without augmentation and had uncomplicated delivery. Membrane Rupture Time/Date: 7:20 AM ,05/23/2019   Intrapartum Procedures: Episiotomy: None [1]                                         Lacerations:  None [1]  Patient had a delivery of a Viable infant. 05/23/2019  Information for the patient's newborn:  PaIvania, Teagarden0[[id number]]Delivery Method: VBAC, Spontaneous(Filed from Delivery Summary)     Pateint had an uncomplicated postpartum course.  She is ambulating, tolerating a regular diet, passing flatus, and urinating well. Patient is discharged home in stable condition on 05/25/19.  Delivery time: 2:48 PM    Magnesium Sulfate received: No BMZ received: No Rhophylac:Yes MMR:No Transfusion:No  Physical exam  Vitals:   05/24/19 0630 05/24/19 1404 05/24/19 2237 05/25/19 0641  BP: 95/62 98/60 110/74  116/72  Pulse: 90 90 78 77  Resp: _0 Temp: 98.1 F (36.7 C) 98.5 F (36.9 C) 99.5 F (37.5 C) 98.8 F (37.1 C)  TempSrc: Axillary Axillary Oral Oral  SpO2: 100% 100% 100% 99%  Weight:      Height:       General: alert, cooperative and no distress Lochia: appropriate Uterine Fundus: firm Incision: N/A DVT Evaluation: No evidence of DVT seen on physical exam. Labs: Lab Results  Component Value Date   WBC 9.3 05/23/2019   HGB 9.9 (L) 05/23/2019   HCT 31.2 (L) 05/23/2019   MCV 83.4 05/23/2019   PLT 366 05/23/2019   CMP Latest Ref Rng & Units 07/19/2016  Glucose 65 - 99 mg/dL 110(H)  BUN 6 - 20 mg/dL 7  Creatinine 0.44 - 1.00 mg/dL 0.94  Sodium 135 - 145 mmol/L 133(L)  Potassium 3.5 - 5.1 mmol/L 3.7  Chloride 101 - 111 mmol/L 104  CO2 22 - 32 mmol/L 20(L)  Calcium 8.9 -  10.3 mg/dL 8.8(L)  Total Protein 6.5 - 8.1 g/dL 7.9  Total Bilirubin 0.3 - 1.2 mg/dL 0.6  Alkaline Phos 38 - 126 U/L 86  AST 15 - 41 U/L 18  ALT 14 - 54 U/L 18    Discharge instruction: per After Visit Summary and "Baby and Me Booklet".  After visit meds:  Allergies as of 05/25/2019   No Known Allergies     Medication List    TAKE these medications   ibuprofen 600 MG tablet Commonly known as: ADVIL Take 1 tablet (600 mg total) by mouth every 6 (six) hours.   metoCLOPramide 10 MG tablet Commonly known as: REGLAN Take 1 tablet (10 mg total) by mouth every 6 (six) hours. Use for management of nausea and vomiting during the day   PrePLUS 27-1 MG Tabs Take by mouth.   promethazine 25 MG tablet Commonly known as: PHENERGAN Take 1 tablet (25 mg total) by mouth every 6 (six) hours as needed for nausea or vomiting. Use in the evening and overnight as needed       Diet: routine diet  Activity: Advance as tolerated. Pelvic rest for 6 weeks.   Outpatient follow up:4 weeks Follow up Appt:No future appointments. Follow up Visit: Follow-up Information    Department, [facility] Follow up in 6 week(s).   Contact information: [address] [place] [address] [phone]            Patient to schedule appt with HD.  Newborn Data: Live born female  Birth Weight: 2835g, 6lbs 4 oz APGAR: 31, 9  Newborn Delivery   Birth date/time: 05/23/2019 14:48:00 Delivery type: Vaginal, Spontaneous      Baby Feeding: Bottle and Breast Disposition:home with mother   05/25/2019 Fatima Blank, CNM

## 2019-05-23 NOTE — MAU Note (Signed)
Patient reports to MAU c/o ctx since 0400 patient unsure of how close the ctx are but states they are intense. Pt reports +FM. Denies bleeding or LOF. Pt reports a increase in urinary frequency and pain when urinating.

## 2019-05-23 NOTE — MAU Note (Signed)
Covid swab obtained without difficulty and pt tol well. No symptoms 

## 2019-05-23 NOTE — MAU Provider Note (Signed)
Chief Complaint:  Contractions   First Provider Initiated Contact with Patient 05/23/19 0740      HPI: Alice Frye is a 28 y.o. [id number] at 36w1dwho presents to maternity admissions reporting contractions and possible leaking of fluid. RN was doing a labor evaluation when she noticed leaking. . She reports good fetal movement, denies vaginal bleeding, vaginal itching/burning, urinary symptoms, h/a, dizziness, n/v, diarrhea, constipation or fever/chills.    Past Medical History: Past Medical History:  Diagnosis Date  . Medical history non-contributory     Past obstetric history: OB History  Gravida Para Term Preterm AB Living  4 2 2   1 2   SAB TAB Ectopic Multiple Live Births    1   0 2    # Outcome Date GA Lbr Len/2nd Weight Sex Delivery Anes PTL Lv  4 Current           3 Term 06/21/14 [redacted]w[redacted]d 11:55 / 00:33 2820 g F VBAC, Vacuum EPI  LIV  2 Term 03/28/07    M CS-LTranv   LIV  1 TAB             Past Surgical History: Past Surgical History:  Procedure Laterality Date  . CESAREAN SECTION      Family History: Family History  Problem Relation Age of Onset  . Diabetes Mother   . Hypertension Mother   . Diabetes Father   . Diabetes Sister   . Heart disease Neg Hx   . Stroke Neg Hx   . Alcohol abuse Neg Hx     Social History: Social History   Tobacco Use  . Smoking status: Never Smoker  . Smokeless tobacco: Never Used  Substance Use Topics  . Alcohol use: Not Currently    Comment: occ  . Drug use: No    Allergies: No Known Allergies  Meds:  Medications Prior to Admission  Medication Sig Dispense Refill Last Dose  . Prenatal Vit-Fe Fumarate-FA (PREPLUS) 27-1 MG TABS Take by mouth.   05/22/2019 at Unknown time  . metoCLOPramide (REGLAN) 10 MG tablet Take 1 tablet (10 mg total) by mouth every 6 (six) hours. Use for management of nausea and vomiting during the day (Patient not taking: Reported on 11/21/2018) 30 tablet 0   . promethazine (PHENERGAN) 25 MG tablet  Take 1 tablet (25 mg total) by mouth every 6 (six) hours as needed for nausea or vomiting. Use in the evening and overnight as needed 30 tablet 0 More than a month at Unknown time    I have reviewed patient's Past Medical Hx, Surgical Hx, Family Hx, Social Hx, medications and allergies.   ROS:  Review of Systems  Constitutional: Negative for chills and fever.  Respiratory: Negative for shortness of breath.   Gastrointestinal: Positive for abdominal pain. Negative for constipation, diarrhea and nausea.  Genitourinary: Positive for pelvic pain and vaginal discharge. Negative for vaginal bleeding.  Neurological: Negative for dizziness.   Other systems negative  Physical Exam   Patient Vitals for the past 24 hrs:  BP Temp Temp src Pulse Resp  05/23/19 0641 126/81 98.9 F (37.2 C) Oral 94 19   Constitutional: Well-developed, well-nourished female in no acute distress.  Cardiovascular: normal rate and rhythm Respiratory: normal effort, clear to auscultation bilaterally GI: Abd soft, non-tender, gravid appropriate for gestational age.   No rebound or guarding. MS: Extremities nontender, no edema, normal ROM Neurologic: Alert and oriented x 4.  GU: Neg CVAT.  PELVIC EXAM: Cervix pink, visually closed, without  lesion, scant clear blood tinged discharge, vaginal walls and external genitalia normal   No pooling but there was ferning.   My cervical exam is only 3cm Dilation: 4 Effacement (%): 90 Station: -2 Presentation: Vertex Exam by:: Blanchard Mane RN Dilation: 3 Effacement (%): 80 Cervical Position: Middle Station: -2 Presentation: Vertex Exam by:: [place] CNM  FHT:  Baseline 140 , moderate variability, accelerations present, no decelerations Contractions: q 3 mins Irregular     Labs: Results for orders placed or performed during the hospital encounter of 05/23/19 (from the past 24 hour(s))  Urinalysis, Routine w reflex microscopic     Status: Abnormal   Collection Time:  05/23/19  6:36 AM  Result Value Ref Range   Color, Urine YELLOW YELLOW   APPearance CLEAR CLEAR   Specific Gravity, Urine 1.023 1.005 - 1.030   pH 6.0 5.0 - 8.0   Glucose, UA NEGATIVE NEGATIVE mg/dL   Hgb urine dipstick SMALL (A) NEGATIVE   Bilirubin Urine NEGATIVE NEGATIVE   Ketones, ur NEGATIVE NEGATIVE mg/dL   Protein, ur NEGATIVE NEGATIVE mg/dL   Nitrite NEGATIVE NEGATIVE   Leukocytes,Ua NEGATIVE NEGATIVE   RBC / HPF 0-5 0 - 5 RBC/hpf   WBC, UA 0-5 0 - 5 WBC/hpf   Bacteria, UA RARE (A) NONE SEEN   Squamous Epithelial / LPF 0-5 0 - 5   Mucus PRESENT    Amorphous Crystal PRESENT       Imaging:  No results found.  MAU Course/MDM: I have ordered labs and reviewed results.  NST reviewed, reactive with irregular contractions,  Patient is quite uncomfortable with them  Treatments in MAU included EFM, SSE.    Assessment: Single intrauterine pregnancy at [redacted]w[redacted]d Latent phase labor PROM at term  Plan: Admit to Labor and Delivery Routine orders Labor team to follow  Hansel Feinstein CNM, MSN Certified Nurse-Midwife 05/23/2019 7:47 AM

## 2019-05-23 NOTE — H&P (Addendum)
OBSTETRIC ADMISSION HISTORY AND PHYSICAL  Alice Frye is a 28 y.o. female [phone] with IUP at [redacted]w[redacted]d presenting for SOL. She reports +Fms and LOF without VB,  She denies blurry vision, headaches, peripheral edema, or RUQ pain. She plans on breast and bottle feeding. She requests Nexplanon for birth control. Having baby boy and requests outpatient circumcision.   Dating: By LMP c/w 12 wk [place] --->  Estimated Date of Delivery: 05/29/19  Sono:  01/06/2019 @[redacted]w[redacted]d , CWD, normal anatomy with anterior placenta  EFW by Leopold's 6#  Prenatal History/Complications: C/S for failure to dilate/chorio 2008 VBAC 2016 Rh Negative GBS positive  Past Medical History: Past Medical History:  Diagnosis Date  . Medical history non-contributory     Past Surgical History: Past Surgical History:  Procedure Laterality Date  . CESAREAN SECTION      Obstetrical History: OB History    Gravida  4   Para  2   Term  2   Preterm      AB  1   Living  2     SAB      TAB  1   Ectopic      Multiple  0   Live Births  2           Social History: Social History   Socioeconomic History  . Marital status: Married    Spouse name: Not on file  . Number of children: Not on file  . Years of education: Not on file  . Highest education level: Not on file  Occupational History  . Not on file  Tobacco Use  . Smoking status: Never Smoker  . Smokeless tobacco: Never Used  Substance and Sexual Activity  . Alcohol use: Not Currently    Comment: occ  . Drug use: No  . Sexual activity: Yes  Other Topics Concern  . Not on file  Social History Narrative   Lives with child Zavaughn   Social Determinants of Health   Financial Resource Strain:   . Difficulty of Paying Living Expenses: Not on file  Food Insecurity:   . Worried About 2017 in the Last Year: Not on file  . Ran Out of Food in the Last Year: Not on file  Transportation Needs:   . Lack of Transportation  (Medical): Not on file  . Lack of Transportation (Non-Medical): Not on file  Physical Activity:   . Days of Exercise per Week: Not on file  . Minutes of Exercise per Session: Not on file  Stress:   . Feeling of Stress : Not on file  Social Connections:   . Frequency of Communication with Friends and Family: Not on file  . Frequency of Social Gatherings with Friends and Family: Not on file  . Attends Religious Services: Not on file  . Active Member of Clubs or Organizations: Not on file  . Attends Programme researcher, broadcasting/film/video Meetings: Not on file  . Marital Status: Not on file    Family History: Family History  Problem Relation Age of Onset  . Diabetes Mother   . Hypertension Mother   . Diabetes Father   . Diabetes Sister   . Heart disease Neg Hx   . Stroke Neg Hx   . Alcohol abuse Neg Hx     Allergies: No Known Allergies  Medications Prior to Admission  Medication Sig Dispense Refill Last Dose  . Prenatal Vit-Fe Fumarate-FA (PREPLUS) 27-1 MG TABS Take by mouth.   05/22/2019 at  Unknown time  . metoCLOPramide (REGLAN) 10 MG tablet Take 1 tablet (10 mg total) by mouth every 6 (six) hours. Use for management of nausea and vomiting during the day (Patient not taking: Reported on 11/21/2018) 30 tablet 0   . promethazine (PHENERGAN) 25 MG tablet Take 1 tablet (25 mg total) by mouth every 6 (six) hours as needed for nausea or vomiting. Use in the evening and overnight as needed 30 tablet 0 More than a month at Unknown time     Review of Systems   All systems reviewed and negative except as stated in HPI  Blood pressure (!) 98/58, pulse 90, temperature 98.1 F (36.7 C), temperature source Oral, resp. rate 16, height 5' (1.524 m), weight 112 kg, last menstrual period 08/22/2018, SpO2 100 %, unknown if currently breastfeeding. General appearance: alert, cooperative and appears stated age Lungs: regular rate and effort Heart: regular rate  Abdomen: soft, non-tender Extremities: Homans  sign is negative, no sign of DVT Presentation: cephalic Fetal monitoringBaseline: 130 bpm, Variability: Good {> 6 bpm) and Accelerations: Reactive Uterine activity Moderate on Toco Dilation: 4 Effacement (%): 90 Station: -1 Exam by:: Dr Volanda Napoleon and Evonnie Dawes, rn   Prenatal labs: ABO, Rh: --/--/B NEG, B NEG Performed at [facility], [place] [address], [place], [place] [address]  [phone]) Antibody: NEG (12/11 0800) Rubella: Immune (05/21 0000)Immune (10/31/2018) RPR: Nonreactive (05/21 0000) pending HBsAg: Negative (05/21 0000)  HIV: Non-reactive (05/21 0000) Negative (03/06/2019) GBS: Positive/-- (11/19 0000) Positive 1 hr GTT 124 (03/06/2019)  Prenatal Transfer Tool  Maternal Diabetes: No Genetic Screening: Normal Maternal Ultrasounds/Referrals: Normal Fetal Ultrasounds or other Referrals:  None Maternal Substance Abuse:  No Significant Maternal Medications:  None Significant Maternal Lab Results: Group B Strep positive  Results for orders placed or performed during the hospital encounter of 05/23/19 (from the past 24 hour(s))  Urinalysis, Routine w reflex microscopic   Collection Time: 05/23/19  6:36 AM  Result Value Ref Range   Color, Urine YELLOW YELLOW   APPearance CLEAR CLEAR   Specific Gravity, Urine 1.023 1.005 - 1.030   pH 6.0 5.0 - 8.0   Glucose, UA NEGATIVE NEGATIVE mg/dL   Hgb urine dipstick SMALL (A) NEGATIVE   Bilirubin Urine NEGATIVE NEGATIVE   Ketones, ur NEGATIVE NEGATIVE mg/dL   Protein, ur NEGATIVE NEGATIVE mg/dL   Nitrite NEGATIVE NEGATIVE   Leukocytes,Ua NEGATIVE NEGATIVE   RBC / HPF 0-5 0 - 5 RBC/hpf   WBC, UA 0-5 0 - 5 WBC/hpf   Bacteria, UA RARE (A) NONE SEEN   Squamous Epithelial / LPF 0-5 0 - 5   Mucus PRESENT    Amorphous Crystal PRESENT   Fern Test   Collection Time: 05/23/19  7:51 AM  Result Value Ref Range   POCT Fern Test Positive = ruptured amniotic membanes   Type and screen [place]   Collection Time:  05/23/19  8:00 AM  Result Value Ref Range   ABO/RH(D) B NEG    Antibody Screen NEG    Sample Expiration      05/26/2019,2359 Performed at [facility], [address], [place], [place] [address]   ABO/Rh   Collection Time: 05/23/19  8:00 AM  Result Value Ref Range   ABO/RH(D)      B NEG Performed at [facility] [address], [place], [place] [address]   CBC   Collection Time: 05/23/19  8:25 AM  Result Value Ref Range  WBC 9.3 4.0 - 10.5 K/uL   RBC 3.74 (L) 3.87 - 5.11 MIL/uL   Hemoglobin 9.9 (L) 12.0 - 15.0 g/dL   HCT 57.831.2 (L) 46.936.0 - 62.946.0 %   MCV 83.4 80.0 - 100.0 fL   MCH 26.5 26.0 - 34.0 pg   MCHC 31.7 30.0 - 36.0 g/dL   RDW 52.816.0 (H) 41.311.5 - 24.415.5 %   Platelets 366 150 - 400 K/uL   nRBC 0.0 0.0 - 0.2 %    Patient Active Problem List   Diagnosis Date Noted  . PROM (premature rupture of membranes) 05/23/2019  . Labor and delivery indication for care or intervention 06/21/2014  . Ruptured, membranes, premature 06/21/2014  . Injury of ankle, left 04/24/2013  . Insertion of Nexplanon 08/29/2012  . 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 General counseling and advice on female contraception 01/04/2012  . OBESITY 05/13/2008    Assessment: Alice Frye is a 28 y.o. [id number] at 4132w1d here for worsening contractions.  1. Labor:  Pitocin as appropriate. 2. FWB: Cat 1 3. Pain: IV meds, Epidural 4. GBS: Positive, treat with Penicillin 5. Rh Negative: Rhogam evaluation post partum   Plan: Admit L&D  FHM Diet as tolerated Anticipate Vaginal Delivery, CS as appropriate   Dana Allananya Walsh, MD  05/23/2019   I saw and evaluated the patient. I agree with the findings and the plan of care as documented in the resident's note. Vertex by exam. Hx of C-section with first pregnancy for failure to progress. Successful VBAC thereafter. Signed VBAC consent 04/22/2019. Anticipate SVD. EFW 3500g. Ultrasounds not scanned into chart. Per HD records, last US 02/10/2019. Patient in  spontaneous labor; will augment with Pit as needed.  Jerilynn Birkenheadhelsea Evalette Montrose, MD [facility] Family Medicine Fellow, [facility], [facility]

## 2019-05-23 NOTE — Anesthesia Preprocedure Evaluation (Signed)
Anesthesia Evaluation  Patient identified by MRN, date of birth, ID band Patient awake    Reviewed: Allergy & Precautions, Patient's Chart, lab work & pertinent test results  Airway Mallampati: III  TM Distance: >3 FB Neck ROM: Full    Dental no notable dental hx. (+) Teeth Intact   Pulmonary neg pulmonary ROS,    Pulmonary exam normal breath sounds clear to auscultation       Cardiovascular negative cardio ROS Normal cardiovascular exam Rhythm:Regular Rate:Normal     Neuro/Psych negative neurological ROS  negative psych ROS   GI/Hepatic Neg liver ROS, GERD  ,  Endo/Other  Morbid obesity  Renal/GU negative Renal ROS  negative genitourinary   Musculoskeletal negative musculoskeletal ROS (+)   Abdominal (+) + obese,   Peds  Hematology negative hematology ROS (+)   Anesthesia Other Findings   Reproductive/Obstetrics (+) Pregnancy Previous C/S                             Anesthesia Physical Anesthesia Plan  ASA: III  Anesthesia Plan: Epidural   Post-op Pain Management:    Induction:   PONV Risk Score and Plan:   Airway Management Planned: Natural Airway  Additional Equipment:   Intra-op Plan:   Post-operative Plan:   Informed Consent: I have reviewed the patients History and Physical, chart, labs and discussed the procedure including the risks, benefits and alternatives for the proposed anesthesia with the patient or authorized representative who has indicated his/her understanding and acceptance.       Plan Discussed with: Anesthesiologist  Anesthesia Plan Comments:         Anesthesia Quick Evaluation

## 2019-05-24 NOTE — Progress Notes (Signed)
Post Partum Day 1 S/p SVD 05/23/19 at 1448 Subjective: up ad lib, voiding, tolerating PO and + flatus. Patient reports improvement in pain with prescribed medications but becomes very uncomfortable just before her next dose is due. She requests discharge tomorrow.  Objective: Blood pressure 95/62, pulse 90, temperature 98.1 F (36.7 C), temperature source Axillary, resp. rate 16, height 5' (1.524 m), weight 112 kg, last menstrual period 08/22/2018, SpO2 100 %, unknown if currently breastfeeding.  Physical Exam:  General: alert, cooperative, appears stated age, no distress and moderately obese Lochia: appropriate Uterine Fundus: firm DVT Evaluation: No evidence of DVT seen on physical exam.  Recent Labs    05/23/19 0825  HGB 9.9*  HCT 31.2*    Assessment/Plan: Plan for discharge tomorrow   LOS: 1 day   Darlina Rumpf, CNM 05/24/2019, 0915 AM

## 2019-05-24 NOTE — Lactation Note (Signed)
This note was copied from a baby's chart. Lactation Consultation Note  Patient Name: Alice Frye [id number] Date: 05/24/2019  P3, 10 hour female infant. LC entered room, per mom she decided she no longer wants to breastfed, she tried it in L&D and did not like it. Per mom, she did not breastfeed her other two children. LC mention to mom if she changes her mind her RN and [facility] services are her to help assist her with breastfeeding.    Maternal Data    Feeding Feeding Type: Bottle Fed - Formula Nipple Type: Slow - flow  LATCH Score                   Interventions    Lactation Tools Discussed/Used     Consult Status      [name] 05/24/2019, 1:33 AM

## 2019-05-24 NOTE — Anesthesia Postprocedure Evaluation (Signed)
Anesthesia Post Note  Patient: Alice Frye  Procedure(s) Performed: AN AD HOC LABOR EPIDURAL     Patient location during evaluation: Mother Baby Anesthesia Type: Epidural Level of consciousness: awake and alert and oriented Pain management: satisfactory to patient Vital Signs Assessment: post-procedure vital signs reviewed and stable Respiratory status: spontaneous breathing and nonlabored ventilation Cardiovascular status: stable Postop Assessment: no headache, no backache, no signs of nausea or vomiting, adequate PO intake, patient able to bend at knees and able to ambulate (patient up walking) Anesthetic complications: no    Last Vitals:  Vitals:   05/24/19 0230 05/24/19 0630  BP: (!) 99/58 95/62  Pulse: 89 90  Resp: 18 16  Temp: 36.6 C 36.7 C  SpO2: 99% 100%    Last Pain:  Vitals:   05/24/19 0817  TempSrc:   PainSc: 0-No pain   Pain Goal:                Epidural/Spinal Function Cutaneous sensation: Normal sensation (05/24/19 0817)  Willa Rough

## 2019-05-25 DIAGNOSIS — Z975 Presence of (intrauterine) contraceptive device: Secondary | ICD-10-CM

## 2019-05-25 DIAGNOSIS — O34219 Maternal care for unspecified type scar from previous cesarean delivery: Secondary | ICD-10-CM

## 2019-05-25 DIAGNOSIS — Z30017 Encounter for initial prescription of implantable subdermal contraceptive: Secondary | ICD-10-CM

## 2019-05-25 MED ORDER — IBUPROFEN 600 MG PO TABS: 600.0000 mg | ORAL_TABLET | Freq: Four times a day (QID) | ORAL | 0 refills | Status: DC

## 2019-05-25 MED ORDER — ETONOGESTREL 68 MG ~~LOC~~ IMPL
68.0000 mg | DRUG_IMPLANT | Freq: Once | SUBCUTANEOUS | Status: AC
  Administered 2019-05-25: 68 mg via SUBCUTANEOUS
  Filled 2019-05-25: qty 1

## 2019-05-25 MED ORDER — LIDOCAINE HCL 1 % IJ SOLN
0.0000 mL | Freq: Once | INTRAMUSCULAR | Status: AC | PRN
  Administered 2019-05-25: 20 mL via INTRADERMAL
  Filled 2019-05-25: qty 20

## 2019-05-25 NOTE — Procedures (Signed)

## 2019-05-26 LAB — BPAM RBC
Blood Product Expiration Date: 202101032359
Blood Product Expiration Date: 202101112359
Unit Type and Rh: 1700
Unit Type and Rh: 1700

## 2019-05-26 LAB — TYPE AND SCREEN
ABO/RH(D): B NEG
Antibody Screen: NEGATIVE
Unit division: 0
Unit division: 0

## 2019-11-12 ENCOUNTER — Other Ambulatory Visit: Payer: Self-pay | Admitting: Family

## 2019-11-12 DIAGNOSIS — N6320 Unspecified lump in the left breast, unspecified quadrant: Secondary | ICD-10-CM

## 2019-11-21 ENCOUNTER — Ambulatory Visit
Admission: RE | Admit: 2019-11-21 | Discharge: 2019-11-21 | Disposition: A | Discharge status: Home / Self Care | Payer: Medicaid Other | Source: Ambulatory Visit | Attending: Family | Admitting: Family

## 2019-11-21 ENCOUNTER — Other Ambulatory Visit: Payer: Self-pay

## 2019-11-21 DIAGNOSIS — N6320 Unspecified lump in the left breast, unspecified quadrant: Secondary | ICD-10-CM

## 2020-01-14 ENCOUNTER — Ambulatory Visit (INDEPENDENT_AMBULATORY_CARE_PROVIDER_SITE_OTHER): Payer: Medicaid Other

## 2020-01-14 ENCOUNTER — Ambulatory Visit (HOSPITAL_COMMUNITY)
Admission: EM | Admit: 2020-01-14 | Discharge: 2020-01-14 | Disposition: A | Discharge status: Home / Self Care | Payer: Medicaid Other | Attending: Family Medicine | Admitting: Family Medicine

## 2020-01-14 ENCOUNTER — Encounter (HOSPITAL_COMMUNITY): Payer: Self-pay

## 2020-01-14 DIAGNOSIS — M25571 Pain in right ankle and joints of right foot: Secondary | ICD-10-CM

## 2020-01-14 DIAGNOSIS — S99911A Unspecified injury of right ankle, initial encounter: Secondary | ICD-10-CM

## 2020-01-14 DIAGNOSIS — S93491A Sprain of other ligament of right ankle, initial encounter: Secondary | ICD-10-CM

## 2020-01-14 NOTE — Discharge Instructions (Signed)
You have been diagnosed with an ankle sprain today. Please wear the ankle brace provided for the next week. If crutches were provided, please use them to remain non weight bearing for the next 2 days. After this, you may gradually begin to bear weight as tolerated. If possible, elevate your ankle when seated. Applying ice for 20 minutes at a time every hour as needed may help with any pain for swelling you may have. You may also  take Ibuprofen 3 times daily for pain and inflammation. Follow up with your doctor or an orthopaedist in 1 week if you are not seeing significant improvement. °

## 2020-01-14 NOTE — ED Triage Notes (Addendum)
Pt presents to UC for right ankle pain. Pt states she rolled her ankle in hole x1 week ago. Pt states she has full range of motion. Pt concerned for symptom length and worsening. Pt states pain is spreading up leg now. Pt has been treating with ibuprofen with out relief.   No swelling, bruising, or obvious deformity noted on exam.

## 2020-01-15 NOTE — ED Provider Notes (Signed)
[facility]   [id number] 01/14/20 Arrival Time: 1705  ASSESSMENT & PLAN:  1. Acute right ankle pain   2. Sprain of anterior talofibular ligament of right ankle, initial encounter     I have personally viewed the imaging studies ordered this visit. No fracture of R ankle.   See AVS for d/c information.  Orders Placed This Encounter  Procedures   DG Ankle Complete Right   Apply ASO lace-up ankle brace   OTC analgesics if needed. WBAT.  Recommend:  Follow-up Information    [facility].   Why: If worsening or failing to improve as anticipated. Contact information: [address] [place] [address] [phone]              Reviewed expectations re: course of current medical issues. Questions answered. Outlined signs and symptoms indicating need for more acute intervention. Patient verbalized understanding. After Visit Summary given.  SUBJECTIVE: History from: patient. Alice Frye is a 29 y.o. female who reports rolling ankle several d ago; able to bear weight; still with lateral pain and mild swelling. No extremity sensation changes or weakness.  Symptoms have progressed to a point and plateaued since beginning. Aggravating factors: certain movements. Alleviating factors: have not been identified. Associated symptoms: none reported. Self treatment: ibuprofen with some help.  History of similar: no.  Past Surgical History:  Procedure Laterality Date   CESAREAN SECTION        OBJECTIVE:  Vitals:   01/14/20 1834  BP: (!) 122/55  Pulse: 77  Resp: 18  Temp: 99.2 F (37.3 C)  TempSrc: Oral  SpO2: 100%    General appearance: alert; no distress HEENT: Alice Frye; AT Neck: supple with FROM Resp: unlabored respirations Extremities:  RLE: warm with well perfused appearance; fairly well localized moderate tenderness over right lateral ankle including ATFL distribution; without gross deformities;  swelling: minimal; bruising: none; ankle ROM: normal, with discomfort CV: brisk extremity capillary refill of RLE; 2+ DP pulse of RLE. Skin: warm and dry; no visible rashes Neurologic: normal sensation and strength of RLE Psychological: alert and cooperative; normal mood and affect  Imaging: DG Ankle Complete Right  Result Date: 01/14/2020 CLINICAL DATA:  Inversion injury last week. EXAM: RIGHT ANKLE - COMPLETE 3+ VIEW COMPARISON:  None. FINDINGS: Lateral soft tissue swelling. No evidence of fracture or dislocation. IMPRESSION: Lateral soft tissue swelling but no evidence of fracture or dislocation. Electronically Signed   By: Paulina Fusi M.D.   On: 01/14/2020 19:02      No Known Allergies  Past Medical History:  Diagnosis Date   Medical history non-contributory    Social History   Socioeconomic History   Marital status: Married    Spouse name: Not on file   Number of children: Not on file   Years of education: Not on file   Highest education level: Not on file  Occupational History   Not on file  Tobacco Use   Smoking status: Never Smoker   Smokeless tobacco: Never Used  Vaping Use   Vaping Use: Never used  Substance and Sexual Activity   Alcohol use: Not Currently    Comment: occ   Drug use: No   Sexual activity: Yes  Other Topics Concern   Not on file  Social History Narrative   Lives with child Alice Frye   Social Determinants of Health   Financial Resource Strain:    Difficulty of Paying Living Expenses:   Food Insecurity:  Worried About Programme researcher, broadcasting/film/video in the Last Year:    Barista in the Last Year:   Transportation Needs:    Freight forwarder (Medical):    Lack of Transportation (Non-Medical):   Physical Activity:    Days of Exercise per Week:    Minutes of Exercise per Session:   Stress:    Feeling of Stress :   Social Connections:    Frequency of Communication with Friends and Family:    Frequency of Social  Gatherings with Friends and Family:    Attends Religious Services:    Active Member of Clubs or Organizations:    Attends Engineer, structural:    Marital Status:    Family History  Problem Relation Age of Onset   Diabetes Mother    Hypertension Mother    Diabetes Father    Diabetes Sister    Heart disease Neg Hx    Stroke Neg Hx    Alcohol abuse Neg Hx    Past Surgical History:  Procedure Laterality Date   CESAREAN SECTION        Alice Layman, MD 01/15/20 [phone]

## 2020-08-07 ENCOUNTER — Ambulatory Visit (HOSPITAL_COMMUNITY)
Admission: EM | Admit: 2020-08-07 | Discharge: 2020-08-07 | Disposition: A | Discharge status: Home / Self Care | Payer: Medicaid Other | Attending: Student | Admitting: Student

## 2020-08-07 ENCOUNTER — Other Ambulatory Visit: Payer: Self-pay

## 2020-08-07 ENCOUNTER — Encounter (HOSPITAL_COMMUNITY): Payer: Self-pay | Admitting: *Deleted

## 2020-08-07 DIAGNOSIS — M79641 Pain in right hand: Secondary | ICD-10-CM | POA: Diagnosis not present

## 2020-08-07 DIAGNOSIS — G5603 Carpal tunnel syndrome, bilateral upper limbs: Secondary | ICD-10-CM | POA: Diagnosis not present

## 2020-08-07 DIAGNOSIS — R519 Headache, unspecified: Secondary | ICD-10-CM | POA: Diagnosis not present

## 2020-08-07 DIAGNOSIS — M62838 Other muscle spasm: Secondary | ICD-10-CM

## 2020-08-07 DIAGNOSIS — M79642 Pain in left hand: Secondary | ICD-10-CM

## 2020-08-07 DIAGNOSIS — M5412 Radiculopathy, cervical region: Secondary | ICD-10-CM

## 2020-08-07 LAB — CBG MONITORING, ED: Glucose-Capillary: 88 mg/dL (ref 70–99)

## 2020-08-07 MED ORDER — PREDNISONE 20 MG PO TABS
40.0000 mg | ORAL_TABLET | Freq: Every day | ORAL | 0 refills | Status: AC
Start: 2020-08-07 — End: 2020-08-12

## 2020-08-07 MED ORDER — TIZANIDINE HCL 2 MG PO CAPS: 2.0000 mg | ORAL_CAPSULE | Freq: Three times a day (TID) | ORAL | 0 refills | Status: DC

## 2020-08-07 NOTE — Discharge Instructions (Addendum)
-  For your muscle spasms, start the muscle relaxer- tizanidine, up to 3x daily. This can cause drowsiness so take when you don't have to drive or operate machinery.  -For your pinched nerve, start prednisone- two pills taken together in the morning x5 days.  -Wear the wrist braces at night for 1-2 weeks, or longer if symptoms persist.  -Please head to the ED for additional imaging of your spine and nerves.  -Also follow-up with PCP/Health Dept if symptoms worsen/persist.

## 2020-08-07 NOTE — ED Triage Notes (Signed)
C/O HA and bilat hand tingling with pain onset last night.  Denies injury.  States has been using hand weights recently.  C/O bilat hand swelling.  BUE digits all warm with prompt cap refill.

## 2020-08-07 NOTE — ED Notes (Signed)
CBG 88 

## 2020-08-07 NOTE — ED Provider Notes (Signed)
[facility]    CSN: [id number] Arrival date & time: 08/07/20  1000      History   Chief Complaint Chief Complaint  Patient presents with  . Hand Pain  . Headache    HPI Alice Frye is a 30 y.o. female presenting with headache, neck pain, and bilateral hand tingling, onset last night.  Denies injury but states has been doing a new exercise regimen- using hand weights recently.  C/O bilat hand swelling and pain of both wrists and hands with movement.  Also stating her legs start hurting and cramping if she leaves her knees in flexion for long periods of time. States her neck hurts, but denies radiation of pain down arms or legs.  Denies worst headache of life, thunderclap headache,  vision changes, shortness of breath, chest pain/pressure, photophobia, phonophobia, n/v/d, dizziness.       HPI  History reviewed. No pertinent past medical history.  Patient Active Problem List   Diagnosis Date Noted  . VBAC (vaginal birth after Cesarean) 05/25/2019  . Nexplanon in place 05/25/2019  . PROM (premature rupture of membranes) 05/23/2019  . History of cesarean section 05/23/2019  . History of VBAC 05/23/2019  . Labor and delivery indication for care or intervention 06/21/2014  . Ruptured, membranes, premature 06/21/2014  . Injury of ankle, left 04/24/2013  . Insertion of Nexplanon 08/29/2012  . 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 General counseling and advice on female contraception 01/04/2012  . OBESITY 05/13/2008    Past Surgical History:  Procedure Laterality Date  . CESAREAN SECTION      OB History    Gravida  4   Para  3   Term  3   Preterm      AB  1   Living  3     SAB      IAB  1   Ectopic      Multiple  0   Live Births  3            Home Medications    Prior to Admission medications   Medication Sig Start Date End Date Taking? Authorizing Provider  predniSONE (DELTASONE) 20 MG tablet Take 2 tablets (40 mg total) by mouth daily for 5  days. 08/07/20 08/12/20 Yes Rhys MartiniGraham, Elina Streng E, PA-C  tizanidine (ZANAFLEX) 2 MG capsule Take 1 capsule (2 mg total) by mouth 3 (three) times daily. 08/07/20  Yes Rhys MartiniGraham, Kimani Bedoya E, PA-C  ibuprofen (ADVIL) 600 MG tablet Take 1 tablet (600 mg total) by mouth every 6 (six) hours. 05/25/19   Leftwich-Kirby, Wilmer FloorLisa A, CNM  metoCLOPramide (REGLAN) 10 MG tablet Take 1 tablet (10 mg total) by mouth every 6 (six) hours. Use for management of nausea and vomiting during the day Patient not taking: No sig reported 10/27/18   Calvert CantorWeinhold, Samantha C, CNM  Prenatal Vit-Fe Fumarate-FA (PREPLUS) 27-1 MG TABS Take by mouth.    [provider]  promethazine (PHENERGAN) 25 MG tablet Take 1 tablet (25 mg total) by mouth every 6 (six) hours as needed for nausea or vomiting. Use in the evening and overnight as needed 10/27/18   Calvert CantorWeinhold, Samantha C, CNM    Family History Family History  Problem Relation Age of Onset  . Diabetes Mother   . Hypertension Mother   . Diabetes Father   . Diabetes Sister   . Heart disease Neg Hx   . Stroke Neg Hx   . Alcohol abuse Neg Hx     Social History Social  History   Tobacco Use  . Smoking status: Never Smoker  . Smokeless tobacco: Never Used  Vaping Use  . Vaping Use: Never used  Substance Use Topics  . Alcohol use: Yes    Comment: occasionally  . Drug use: Never     Allergies   Patient has no known allergies.   Review of Systems Review of Systems  Constitutional: Negative for chills, fever and unexpected weight change.  Respiratory: Negative for chest tightness and shortness of breath.   Cardiovascular: Negative for chest pain and palpitations.  Gastrointestinal: Negative for abdominal pain, diarrhea, nausea and vomiting.  Genitourinary: Negative for decreased urine volume, difficulty urinating and frequency.  Musculoskeletal: Positive for neck pain. Negative for arthralgias, back pain, gait problem, joint swelling, myalgias and neck stiffness.  Skin: Negative  for wound.  Neurological: Negative for dizziness, tremors, seizures, syncope, facial asymmetry, speech difficulty, weakness, light-headedness, numbness and headaches.       Bilateral hand tingling and stiffness.  All other systems reviewed and are negative.    Physical Exam Triage Vital Signs ED Triage Vitals [08/07/20 1019]  Enc Vitals Group     BP 130/72     Pulse Rate 95     Resp 20     Temp 98.9 F (37.2 C)     Temp Source Oral     SpO2 100 %     Weight      Height      Head Circumference      Peak Flow      Pain Score 9     Pain Loc      Pain Edu?      Excl. in GC?    No data found.  Updated Vital Signs BP 130/72   Pulse 95   Temp 98.9 F (37.2 C) (Oral)   Resp 20   SpO2 100%   Breastfeeding No   Visual Acuity Right Eye Distance:   Left Eye Distance:   Bilateral Distance:    Right Eye Near:   Left Eye Near:    Bilateral Near:     Physical Exam Vitals reviewed.  Constitutional:      General: She is not in acute distress.    Appearance: Normal appearance. She is not ill-appearing.  HENT:     Head: Normocephalic and atraumatic.  Cardiovascular:     Rate and Rhythm: Normal rate and regular rhythm.     Heart sounds: Normal heart sounds.  Pulmonary:     Effort: Pulmonary effort is normal.     Breath sounds: Normal breath sounds and air entry.  Abdominal:     Tenderness: There is no abdominal tenderness. There is no right CVA tenderness, left CVA tenderness, guarding or rebound.     Comments: No bowel or bladder incontinence.  Musculoskeletal:     Cervical back: Normal range of motion. Spasms and tenderness present. No swelling, deformity, signs of trauma, rigidity, bony tenderness or crepitus. No pain with movement.     Thoracic back: No swelling, deformity, signs of trauma, spasms, tenderness or bony tenderness. Normal range of motion. No scoliosis.     Lumbar back: No swelling, deformity, signs of trauma, spasms, tenderness or bony tenderness. Normal  range of motion. Negative right straight leg raise test and negative left straight leg raise test. No scoliosis.     Comments: Strength 5/5 in UEs and LEs. Cervical paraspinous muscle tenderness with flexion cervical spine. Positive spurling. Grip strength 3/5 bilaterally, wrist and finger ROM intact  but with stiffness. No shoulder elbow or wrist tenderness swellling or deformity.  Neurological:     General: No focal deficit present.     Mental Status: She is alert and oriented to person, place, and time.     Cranial Nerves: No cranial nerve deficit.     Sensory: Sensation is intact.     Motor: Motor function is intact.     Coordination: Coordination is intact. Romberg sign negative.     Gait: Gait is intact. Gait normal.     Comments: Strength 5/5 in UEs and LEs. Gait normal. Sensation intact in UEs and LEs. PERRLA, EOMI. Grip strength 3/5 bilaterally. Positive phalen sign. Cap refill <2 seconds bilaterally.   Psychiatric:        Mood and Affect: Mood normal.        Behavior: Behavior normal.        Thought Content: Thought content normal.        Judgment: Judgment normal.      UC Treatments / Results  Labs (all labs ordered are listed, but only abnormal results are displayed) Labs Reviewed  CBG MONITORING, ED    EKG   Radiology No results found.  Procedures Procedures (including critical care time)  Medications Ordered in UC Medications - No data to display  Initial Impression / Assessment and Plan / UC Course  I have reviewed the triage vital signs and the nursing notes.  Pertinent labs & imaging results that were available during my care of the patient were reviewed by me and considered in my medical decision making (see chart for details).      This patient is a 30 year old female presenting with vague complaints of mild intermittent headaches, tingling in bilateral hands, and pain with wrist movement. Adamantly denies worst headache of life, neuro exam  unremarkable.  Plan to treat for carpal tunnel and cervical radiculitis with wrist braces, prednisone, and zanaflex as below.  nonfasting CBG 88 today.   Symptoms consistent with cervical radiculitis and carpal tunnel. However, I am concerned about this patient's abrupt onset of difficulty moving her hands. I am recommending she head straight to ED for imaging of spine- MRI or CT. She states she does not want to go. She understands that if she does not go she could die.   Spent over 40 minutes obtaining H&P, performing physical, discussing results, treatment plan and plan for follow-up with patient. Patient agrees with plan.     Final Clinical Impressions(s) / UC Diagnoses   Final diagnoses:  Cervical radiculitis  Bilateral carpal tunnel syndrome  Trapezius muscle spasm     Discharge Instructions     -For your muscle spasms, start the muscle relaxer- tizanidine, up to 3x daily. This can cause drowsiness so take when you don't have to drive or operate machinery.  -For your pinched nerve, start prednisone- two pills taken together in the morning x5 days.  -Wear the wrist braces at night for 1-2 weeks, or longer if symptoms persist.  -Please head to the ED for additional imaging of your spine and nerves.  -Also follow-up with PCP/Health Dept if symptoms worsen/persist.    ED Prescriptions    Medication Sig Dispense Auth. Provider   tizanidine (ZANAFLEX) 2 MG capsule Take 1 capsule (2 mg total) by mouth 3 (three) times daily. 21 capsule Rhys Martini, PA-C   predniSONE (DELTASONE) 20 MG tablet Take 2 tablets (40 mg total) by mouth daily for 5 days. 10 tablet Rhys Martini, PA-C  PDMP not reviewed this encounter.   Rhys Martini, PA-C 08/07/20 1214

## 2021-01-20 IMAGING — MG DIGITAL DIAGNOSTIC BILAT W/ TOMO W/ CAD
8 of 12 series · 8 of 32 positions shown · non-contrast
Comparison: None.

CLINICAL DATA: 28-year-old female. Patient's physician palpated an
abnormality in the lower-inner quadrant of the left breast.

EXAM:
DIGITAL DIAGNOSTIC BILATERAL MAMMOGRAM WITH CAD AND TOMO
ULTRASOUND LEFT BREAST

[L MLO]
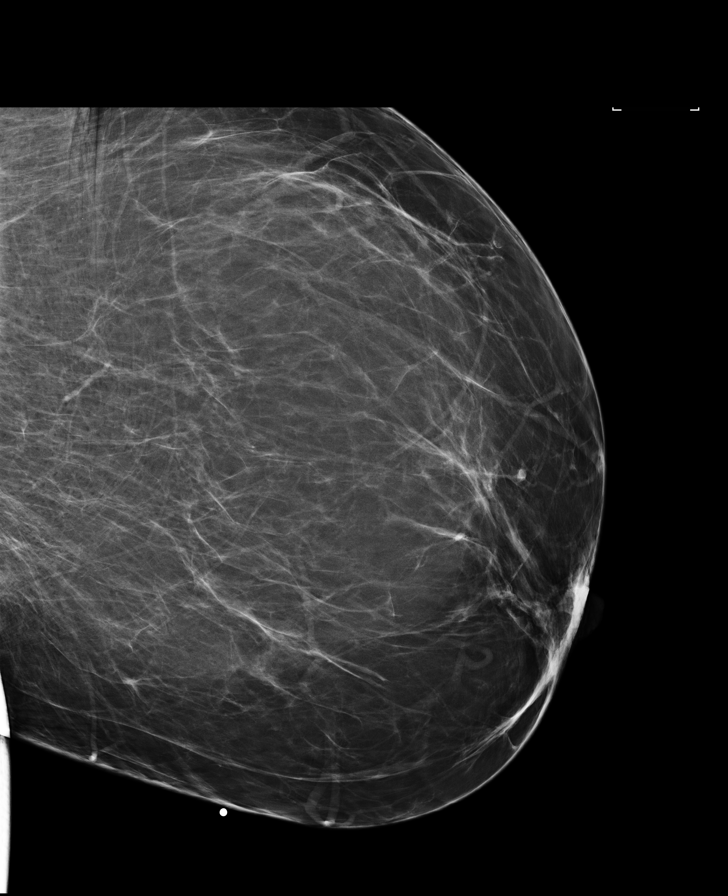

[R MLO]
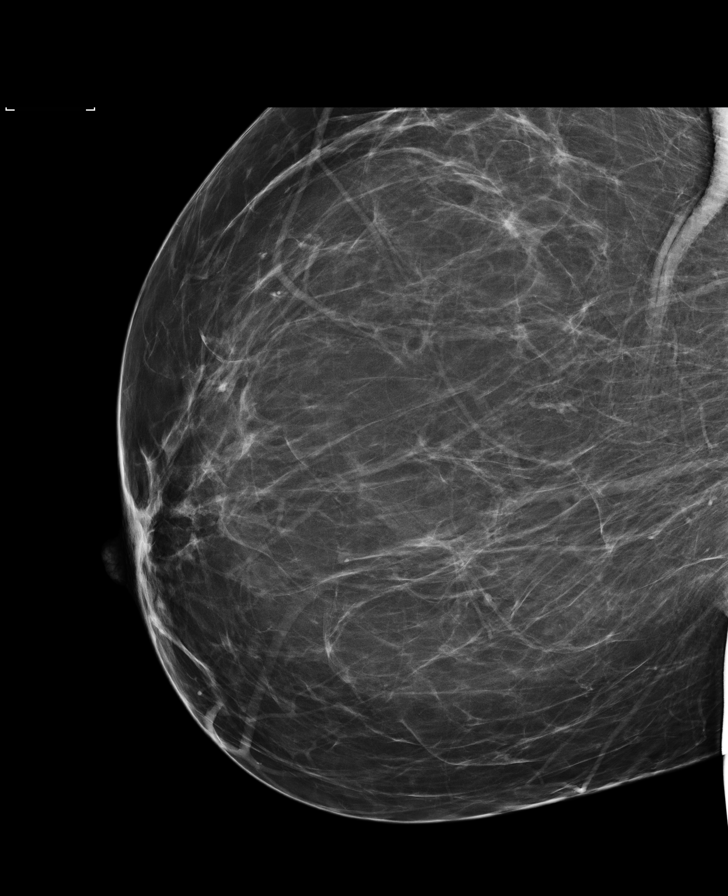

[L ML synth-2D]
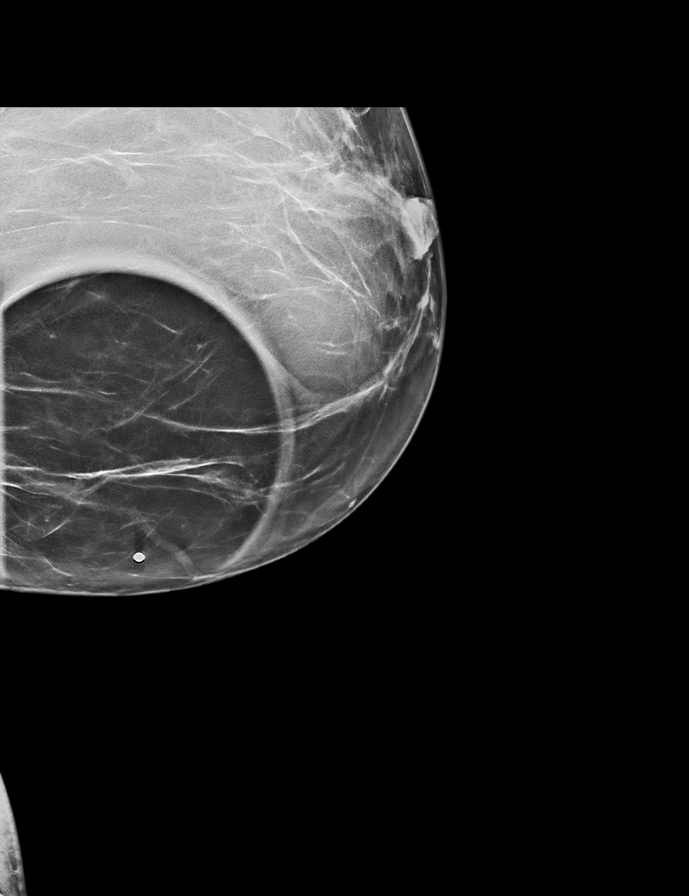

[R CC synth-2D]
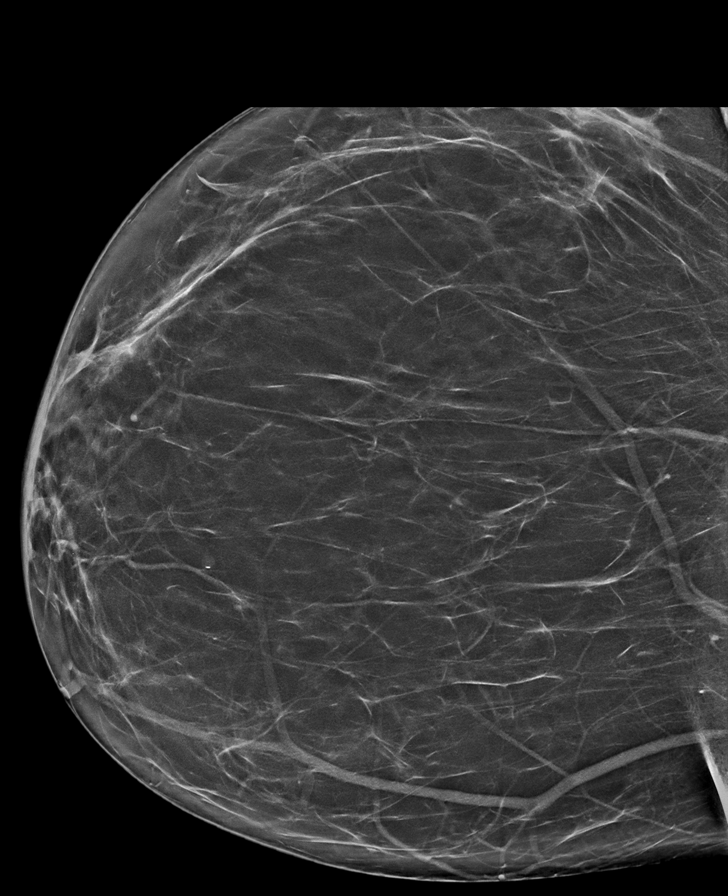

[L CC synth-2D]
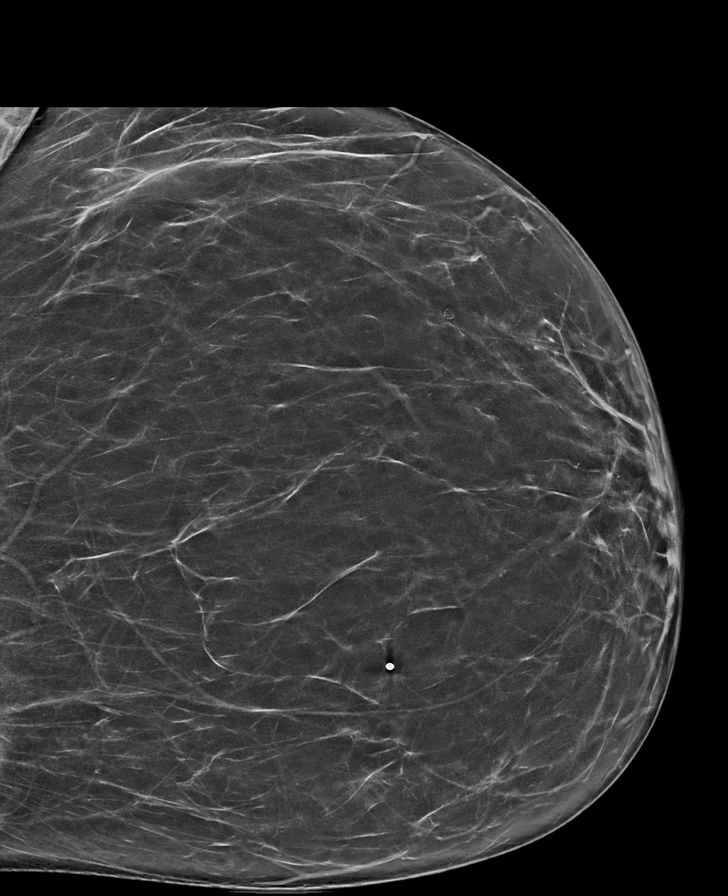

[R MLO synth-2D]
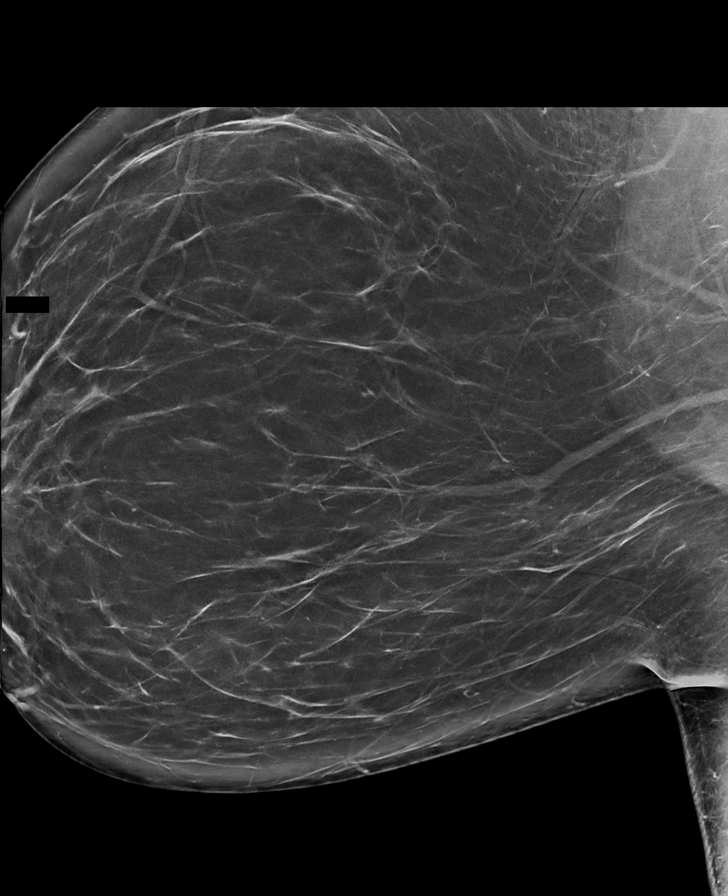

[L MLO synth-2D]
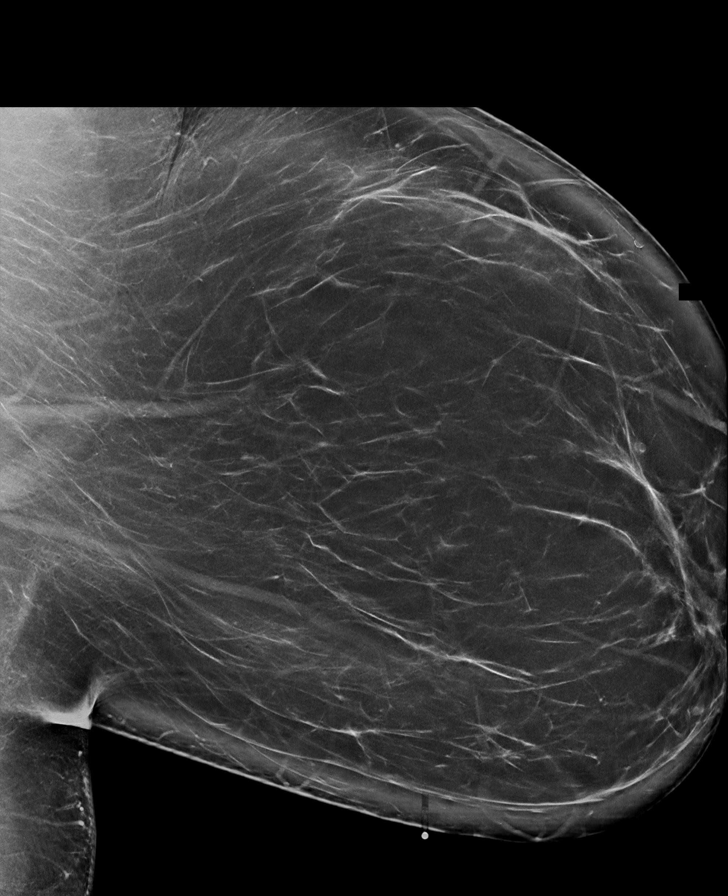

[R CC tomo · tomo slice 42/83.0]
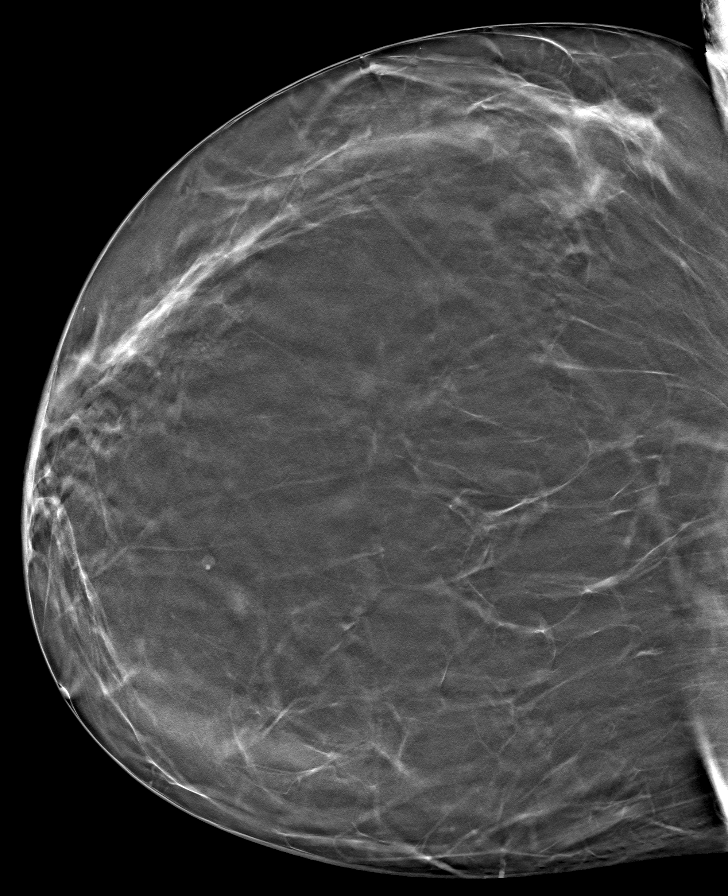

[8 of 32 positions shown; findings below may reference images not displayed]

ACR Breast Density Category b: There are scattered areas of
fibroglandular density.
FINDINGS: No suspicious mass, malignant type microcalcifications or distortion
detected in either breast.

Mammographic images were processed with CAD.

On physical exam, I do not palpate a mass in the lower-inner
quadrant of the left breast.

Targeted ultrasound is performed, showing normal tissue in the area
of clinical concern in the lower-inner quadrant of the left breast.
No solid or cystic mass, abnormal shadowing or distortion
visualized.
IMPRESSION: No evidence of malignancy in either breast.

RECOMMENDATION:
If the clinical exam remains benign/stable screening mammography can
be deferred until the age of 40

I have discussed the findings and recommendations with the patient.
If applicable, a reminder letter will be sent to the patient
regarding the next appointment.

BI-RADS CATEGORY  1: Negative.

## 2021-04-01 ENCOUNTER — Encounter (HOSPITAL_COMMUNITY): Payer: Self-pay

## 2021-04-01 ENCOUNTER — Ambulatory Visit (HOSPITAL_COMMUNITY)
Admission: EM | Admit: 2021-04-01 | Discharge: 2021-04-01 | Disposition: A | Discharge status: Home / Self Care | Payer: Medicaid Other | Attending: Emergency Medicine | Admitting: Emergency Medicine

## 2021-04-01 ENCOUNTER — Other Ambulatory Visit: Payer: Self-pay

## 2021-04-01 DIAGNOSIS — K029 Dental caries, unspecified: Secondary | ICD-10-CM | POA: Diagnosis not present

## 2021-04-01 DIAGNOSIS — K0889 Other specified disorders of teeth and supporting structures: Secondary | ICD-10-CM | POA: Diagnosis not present

## 2021-04-01 MED ORDER — IBUPROFEN 800 MG PO TABS: 800.0000 mg | ORAL_TABLET | Freq: Four times a day (QID) | ORAL | 1 refills | Status: DC

## 2021-04-01 MED ORDER — LIDOCAINE VISCOUS HCL 2 % MT SOLN
15.0000 mL | OROMUCOSAL | 0 refills | Status: DC | PRN
Start: 2021-04-01 — End: 2021-08-09

## 2021-04-01 MED ORDER — IBUPROFEN 800 MG PO TABS
800.0000 mg | ORAL_TABLET | Freq: Three times a day (TID) | ORAL | 1 refills | Status: AC
Start: 2021-04-01 — End: ?

## 2021-04-01 MED ORDER — PREDNISONE 20 MG PO TABS
40.0000 mg | ORAL_TABLET | Freq: Every day | ORAL | 0 refills | Status: DC
Start: 2021-04-01 — End: 2021-08-09

## 2021-04-01 NOTE — Discharge Instructions (Addendum)
You are being given a short steroid course today to help with inflammation and ideally reduce irritation to your gums and teeth  Take prednisone every morning with food for the next 5 days  May use lidocaine solution every 4 hours as needed, gargle and spit this will give a temporary numbing sensation  After completion of steroid course she may use ibuprofen 3 times a day (every 8 hours) as needed for additional comfort  Inside your packet is a list of local dental resources that she may reach out to to try to get an appointment, can also contact a [facility] and [facility] dentistry program who does dental work for reduced or free cost

## 2021-04-01 NOTE — ED Provider Notes (Signed)
[facility]    CSN: [id number] Arrival date & time: 04/01/21  1448      History   Chief Complaint Chief Complaint  Patient presents with   Dental Pain    HPI Alice Frye is a 30 y.o. female.   Patient presents with left upper dental pain and gingival swelling occurring intermittently for the last 6 months to 1 year.  Has worsened in severity over the last week.  Believes that she has cavities.  Does not have a current dentist lost coverage once her pregnancy ended.  Denies drainage, fever, chills, difficulty swallowing, difficulty breathing, facial swelling, ear pain or headaches.  Has attempted use of over-the-counter medication with no relief.  Dental Pain Associated symptoms: no congestion, no drooling, no facial swelling and no oral lesions   Dental Pain Associated symptoms: no congestion, no drooling and no facial swelling   Dental Pain Associated symptoms: no congestion and no drooling   Dental Pain Associated symptoms: no congestion    History reviewed. No pertinent past medical history.  Patient Active Problem List   Diagnosis Date Noted   VBAC (vaginal birth after Cesarean) 05/25/2019   Nexplanon in place 05/25/2019   PROM (premature rupture of membranes) 05/23/2019   History of cesarean section 05/23/2019   History of VBAC 05/23/2019   Labor and delivery indication for care or intervention 06/21/2014   Ruptured, membranes, premature 06/21/2014   Injury of ankle, left 04/24/2013   Insertion of Nexplanon 08/29/2012   Vaginosis 08/15/2012   General counseling and advice on female contraception 01/04/2012   OBESITY 05/13/2008    Past Surgical History:  Procedure Laterality Date   CESAREAN SECTION      OB History     Gravida  4   Para  3   Term  3   Preterm      AB  1   Living  3      SAB      IAB  1   Ectopic      Multiple  0   Live Births  3            Home Medications    Prior to Admission medications    Medication Sig Start Date End Date Taking? Authorizing Provider  lidocaine (XYLOCAINE) 2 % solution Use as directed 15 mLs in the mouth or throat as needed for mouth pain. 04/01/21  Yes Shawni Volkov R, NP  predniSONE (DELTASONE) 20 MG tablet Take 2 tablets (40 mg total) by mouth daily. 04/01/21  Yes Marky Buresh R, NP  ibuprofen (ADVIL) 800 MG tablet Take 1 tablet (800 mg total) by mouth 3 (three) times daily. 04/01/21   Valinda Hoar, NP    Family History Family History  Problem Relation Age of Onset   Diabetes Mother    Hypertension Mother    Diabetes Father    Diabetes Sister    Heart disease Neg Hx    Stroke Neg Hx    Alcohol abuse Neg Hx     Social History Social History   Tobacco Use   Smoking status: Never   Smokeless tobacco: Never  Vaping Use   Vaping Use: Never used  Substance Use Topics   Alcohol use: Yes    Comment: occasionally   Drug use: Never     Allergies   Patient has no known allergies.   Review of Systems Review of Systems  Constitutional: Negative.   HENT:  Positive for dental problem. Negative  for congestion, drooling, ear discharge, ear pain, facial swelling, hearing loss, mouth sores, nosebleeds, postnasal drip, rhinorrhea, sinus pressure, sinus pain, sneezing, sore throat, tinnitus, trouble swallowing and voice change.   Respiratory: Negative.    Cardiovascular: Negative.   Neurological: Negative.     Physical Exam Triage Vital Signs ED Triage Vitals  Enc Vitals Group     BP 04/01/21 0844 139/84     Pulse Rate 04/01/21 0844 72     Resp 04/01/21 0844 18     Temp 04/01/21 0844 98.6 F (37 C)     Temp Source 04/01/21 0844 Oral     SpO2 04/01/21 0844 98 %     Weight --      Height --      Head Circumference --      Peak Flow --      Pain Score 04/01/21 0845 10     Pain Loc --      Pain Edu? --      Excl. in GC? --    No data found.  Updated Vital Signs BP 139/84 (BP Location: Left Arm)   Pulse 72   Temp 98.6 F  (37 C) (Oral)   Resp 18   SpO2 98%   Breastfeeding No   Visual Acuity Right Eye Distance:   Left Eye Distance:   Bilateral Distance:    Right Eye Near:   Left Eye Near:    Bilateral Near:     Physical Exam Constitutional:      Appearance: Normal appearance.  HENT:     Head: Normocephalic.     Mouth/Throat:      Comments: Dental caries present in the second premolar, first and second molar of the upper left side, mild to moderate gingival swelling, no dental abscess present Eyes:     Extraocular Movements: Extraocular movements intact.  Pulmonary:     Effort: Pulmonary effort is normal.  Skin:    General: Skin is warm and dry.  Neurological:     Mental Status: She is alert and oriented to person, place, and time. Mental status is at baseline.  Psychiatric:        Mood and Affect: Mood normal.        Behavior: Behavior normal.     UC Treatments / Results  Labs (all labs ordered are listed, but only abnormal results are displayed) Labs Reviewed - No data to display  EKG   Radiology No results found.  Procedures Procedures (including critical care time)  Medications Ordered in UC Medications - No data to display  Initial Impression / Assessment and Plan / UC Course  I have reviewed the triage vital signs and the nursing notes.  Pertinent labs & imaging results that were available during my care of the patient were reviewed by me and considered in my medical decision making (see chart for details).  Dental pain Dental caries  1.  Prednisone 40 mg daily for 5 days 2.  Ibuprofen 800 mg 3 times daily as needed 3.  Lidocaine viscous 2% 15 mls every 4 hours prn 4.  Given dental resources for follow-up Final Clinical Impressions(s) / UC Diagnoses   Final diagnoses:  Dental caries  Pain, dental     Discharge Instructions      You are being given a short steroid course today to help with inflammation and ideally reduce irritation to your gums and  teeth  Take prednisone every morning with food for the next 5 days  May  use lidocaine solution every 4 hours as needed, gargle and spit this will give a temporary numbing sensation  After completion of steroid course she may use ibuprofen 3 times a day (every 8 hours) as needed for additional comfort  Inside your packet is a list of local dental resources that she may reach out to to try to get an appointment, can also contact a [facility] and [facility] dentistry program who does dental work for reduced or free cost    ED Prescriptions     Medication Sig Dispense Auth. Provider   ibuprofen (ADVIL) 800 MG tablet  (Status: Discontinued) Take 1 tablet (800 mg total) by mouth every 6 (six) hours. 30 tablet Keyler Hoge R, NP   lidocaine (XYLOCAINE) 2 % solution Use as directed 15 mLs in the mouth or throat as needed for mouth pain. 100 mL Jhaden Pizzuto R, NP   predniSONE (DELTASONE) 20 MG tablet Take 2 tablets (40 mg total) by mouth daily. 10 tablet Abigaelle Verley R, NP   ibuprofen (ADVIL) 800 MG tablet Take 1 tablet (800 mg total) by mouth 3 (three) times daily. 30 tablet Valinda Hoar, NP      PDMP not reviewed this encounter.   Valinda Hoar, NP 04/01/21 1021

## 2021-04-01 NOTE — ED Triage Notes (Signed)
Pt c/o rt lower toothache with swelling for months.

## 2021-08-09 ENCOUNTER — Ambulatory Visit (HOSPITAL_COMMUNITY)
Admission: EM | Admit: 2021-08-09 | Discharge: 2021-08-09 | Disposition: A | Discharge status: Home / Self Care | Payer: Medicaid Other | Attending: Urgent Care | Admitting: Urgent Care

## 2021-08-09 ENCOUNTER — Encounter (HOSPITAL_COMMUNITY): Payer: Self-pay | Admitting: Emergency Medicine

## 2021-08-09 ENCOUNTER — Other Ambulatory Visit: Payer: Self-pay

## 2021-08-09 DIAGNOSIS — R5381 Other malaise: Secondary | ICD-10-CM | POA: Insufficient documentation

## 2021-08-09 DIAGNOSIS — R5383 Other fatigue: Secondary | ICD-10-CM | POA: Diagnosis not present

## 2021-08-09 DIAGNOSIS — R519 Headache, unspecified: Secondary | ICD-10-CM | POA: Insufficient documentation

## 2021-08-09 DIAGNOSIS — R52 Pain, unspecified: Secondary | ICD-10-CM

## 2021-08-09 DIAGNOSIS — U071 COVID-19: Secondary | ICD-10-CM | POA: Diagnosis not present

## 2021-08-09 DIAGNOSIS — B349 Viral infection, unspecified: Secondary | ICD-10-CM

## 2021-08-09 LAB — POC INFLUENZA A AND B ANTIGEN (URGENT CARE ONLY)
INFLUENZA A ANTIGEN, POC: NEGATIVE
INFLUENZA B ANTIGEN, POC: NEGATIVE

## 2021-08-09 LAB — SARS CORONAVIRUS 2 (TAT 6-24 HRS): SARS Coronavirus 2: POSITIVE — AB

## 2021-08-09 MED ORDER — PSEUDOEPHEDRINE HCL 30 MG PO TABS: 30.0000 mg | ORAL_TABLET | Freq: Three times a day (TID) | ORAL | 0 refills | Status: DC | PRN

## 2021-08-09 MED ORDER — CETIRIZINE HCL 10 MG PO TABS: 10.0000 mg | ORAL_TABLET | Freq: Every day | ORAL | 0 refills | Status: DC

## 2021-08-09 MED ORDER — BENZONATATE 100 MG PO CAPS: 100.0000 mg | ORAL_CAPSULE | Freq: Three times a day (TID) | ORAL | 0 refills | Status: DC | PRN

## 2021-08-09 MED ORDER — ACETAMINOPHEN 325 MG PO TABS: 650.0000 mg | ORAL_TABLET | Freq: Four times a day (QID) | ORAL | 0 refills | Status: DC | PRN

## 2021-08-09 MED ORDER — PROMETHAZINE-DM 6.25-15 MG/5ML PO SYRP
5.0000 mL | ORAL_SOLUTION | Freq: Every evening | ORAL | 0 refills | Status: AC | PRN
Start: 2021-08-09 — End: ?

## 2021-08-09 NOTE — ED Triage Notes (Signed)
Pt reports headache, body aches and fatigue beginning yesterday afternoon.

## 2021-08-09 NOTE — ED Provider Notes (Signed)
[facility]   MRN: [id number] DOB: 05-03-1991  Subjective:   Alice Frye is a 31 y.o. female presenting for 1 day history of acute onset generalized headache, body aches, fatigue and malaise.  No cough, chest pain, shortness of breath, ear pain, sinus pain, neck pain or neck stiffness, fevers.  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  Has not had COVID or flu in the past 6 months.  She is otherwise healthy.  No history of respiratory disorders.  No current facility-administered medications for this encounter.  Current Outpatient Medications:    ibuprofen (ADVIL) 800 MG tablet, Take 1 tablet (800 mg total) by mouth 3 (three) times daily., Disp: 30 tablet, Rfl: 1   No Known Allergies  History reviewed. No pertinent past medical history.   Past Surgical History:  Procedure Laterality Date   CESAREAN SECTION      Family History  Problem Relation Age of Onset   Diabetes Mother    Hypertension Mother    Diabetes Father    Diabetes Sister    Heart disease Neg Hx    Stroke Neg Hx    Alcohol abuse Neg Hx     Social History   Tobacco Use   Smoking status: Never   Smokeless tobacco: Never  Vaping Use   Vaping Use: Never used  Substance Use Topics   Alcohol use: Yes    Comment: occasionally   Drug use: Never    ROS   Objective:   Vitals: BP 129/80 (BP Location: Right Arm)    Pulse (!) 103    Temp 99.5 F (37.5 C) (Oral)    Resp 18    Ht 5' (1.524 m)    Wt 246 lb 14.6 oz (112 kg)    SpO2 96%    BMI 48.22 kg/m   Physical Exam Constitutional:      General: She is not in acute distress.    Appearance: Normal appearance. She is well-developed. She is not ill-appearing, toxic-appearing or diaphoretic.  HENT:     Head: Normocephalic and atraumatic.     Nose: Nose normal.     Mouth/Throat:     Mouth: Mucous membranes are moist.     Pharynx: No pharyngeal swelling, oropharyngeal exudate, posterior oropharyngeal  erythema or uvula swelling.     Tonsils: No tonsillar exudate or tonsillar abscesses. 0 on the right. 0 on the left.  Eyes:     General: No scleral icterus.       Right eye: No discharge.        Left eye: No discharge.     Extraocular Movements: Extraocular movements intact.  Neck:     Meningeal: Brudzinski's sign and Kernig's sign absent.  Cardiovascular:     Rate and Rhythm: Normal rate.     Heart sounds: No murmur heard.   No friction rub. No gallop.  Pulmonary:     Effort: Pulmonary effort is normal. No respiratory distress.     Breath sounds: No stridor. No wheezing, rhonchi or rales.  Chest:     Chest wall: No tenderness.  Musculoskeletal:     Cervical back: No rigidity.  Skin:    General: Skin is warm and dry.  Neurological:     General: No focal deficit present.     Mental Status: She is alert and oriented to person, place, and time.     Cranial Nerves: No cranial nerve deficit, dysarthria or  facial asymmetry.     Motor: No weakness.     Coordination: Coordination normal.     Gait: Gait normal.  Psychiatric:        Mood and Affect: Mood normal.        Speech: Speech normal.        Behavior: Behavior normal.    Results for orders placed or performed during the hospital encounter of 08/09/21 (from the past 24 hour(s))  POC Influenza A & B Ag (Urgent Care)     Status: None   Collection Time: 08/09/21  8:51 AM  Result Value Ref Range   INFLUENZA A ANTIGEN, POC NEGATIVE NEGATIVE   INFLUENZA B ANTIGEN, POC NEGATIVE NEGATIVE    Assessment and Plan :   PDMP not reviewed this encounter.  1. Acute viral syndrome   2. Body aches   3. Generalized headaches   4. Malaise and fatigue    Flu test was negative. Will manage for viral illness such as viral URI, viral syndrome, COVID-19. Recommended supportive care. Offered scripts for symptomatic relief. Testing is pending. Deferred imaging given clear cardiopulmonary exam, hemodynamically stable vital signs. Counseled  patient on potential for adverse effects with medications prescribed/recommended today, ER and return-to-clinic precautions discussed, patient verbalized understanding.     Wallis Bamberg, [place] 08/09/21 [phone]

## 2021-08-09 NOTE — Discharge Instructions (Addendum)
We will notify you of your test results as they arrive and may take between 48-72 hours.  I encourage you to sign up for MyChart if you have not already done so as this can be the easiest way for us to communicate results to you online or through a phone app.  Generally, we only contact you if it is a positive test result.  In the meantime, if you develop worsening symptoms including fever, chest pain, shortness of breath despite our current treatment plan then please report to the emergency room as this may be a sign of worsening status from possible viral infection. ° °Otherwise, we will manage this as a viral syndrome. For sore throat or cough try using a honey-based tea. Use 3 teaspoons of honey with juice squeezed from half lemon. Place shaved pieces of ginger into 1/2-1 cup of water and warm over stove top. Then mix the ingredients and repeat every 4 hours as needed. Please take Tylenol 500mg-650mg every 6 hours for aches and pains, fevers. Hydrate very well with at least 2 liters of water. Eat light meals such as soups to replenish electrolytes and soft fruits, veggies. Start an antihistamine like Zyrtec for postnasal drainage, sinus congestion.  You can take this together with pseudoephedrine (Sudafed) at a dose of 30 mg 2-3 times a day as needed for the same kind of congestion.  Use cough medications as needed.  °

## 2023-07-31 ENCOUNTER — Ambulatory Visit: Payer: Medicaid Other | Admitting: Physical Medicine and Rehabilitation

## 2023-07-31 NOTE — Therapy (Signed)
OUTPATIENT PHYSICAL THERAPY THORACOLUMBAR EVALUATION   Patient Name: Alice Frye MRN: [id number] DOB:[birth year], 33 y.o., female Today's Date: 08/01/2023  END OF SESSION:  PT End of Session - 08/01/23 1004     Visit Number 1    Number of Visits 12    Date for PT Re-Evaluation 09/15/23    Authorization Type [place] [employer]    PT Start Time 1005    PT Stop Time 1050    PT Time Calculation (min) 45 min    Activity Tolerance Patient tolerated treatment well;Patient limited by pain    Behavior During Therapy [facility] for tasks assessed/performed             No past medical history on file. Past Surgical History:  Procedure Laterality Date   CESAREAN SECTION     Patient Active Problem List   Diagnosis Date Noted   VBAC (vaginal birth after Cesarean) 05/25/2019   Nexplanon in place 05/25/2019   PROM (premature rupture of membranes) 05/23/2019   History of cesarean section 05/23/2019   History of VBAC 05/23/2019   Labor and delivery indication for care or intervention 06/21/2014   Ruptured, membranes, premature 06/21/2014   Injury of ankle, left 04/24/2013   Insertion of Nexplanon 08/29/2012   Vaginosis 08/15/2012   General counseling and advice on female contraception 01/04/2012   OBESITY 05/13/2008    PCP: Department, [facility]   REFERRING PROVIDER: Harlen Labs, NP   REFERRING DIAG: thoracic back pain,  (M54.6 Back pain), hypertrophy of breast  THERAPY DIAG:  Bilateral low back pain with right-sided sciatica, unspecified chronicity  Muscle weakness (generalized)  Abnormal posture  Rationale for Evaluation and Treatment: Rehabilitation  ONSET DATE: Pt reports back pain since middle school but worse in the last 6 months  SUBJECTIVE:                                                                                                                                                                                                          SUBJECTIVE STATEMENT: Pt reports back pain since middle school and I just never did anything about it.  My pain has gotten worse in the last 6 months. I am not able to exercise because of pain with my breasts and how affects my back.  I would like to get healthier and walking. I am trying to walk 30-45 minutes. I tried to do do weights at home but I ended up in the hospital. I did not know what to do.  I can't sit for more than  30 minutes.   Hand dominance: Right  PERTINENT HISTORY:  L ankle fx > 5 years, obesity,   PAIN:  Are you having pain? Yes: NPRS scale: without meds  10/10 and at rest now 0/10 sitting but doing chores for more than 30 minutes 10/10  Pain location: low back Pain description: sharp pain in low back and sometimes goes down to my Right buttocks Aggravating factors: sitting for more than 30 minutes. Have to sleep on my sides and constantly at night for only 3-4 hours, laundry,and bending over,  cleaning the tub going up and down steps Relieving factors: pain meds  PRECAUTIONS: None  RED FLAGS: None     WEIGHT BEARING RESTRICTIONS: No  FALLS:  Has patient fallen in last 6 months? Yes. Number of falls 1  going down steps and tripped and fell last week  LIVING ENVIRONMENT: Lives with: lives with their family Lives in: House/apartment Stairs:  no stairs in home but has pain on stairs Has following equipment at home: None  OCCUPATION: Geologist, engineering  PLOF: Independent  PATIENT GOALS: Goals to learn exercises and help my pain and be able to exercise  NEXT MD VISIT: TBD  OBJECTIVE:  Note: Objective measures were completed at Evaluation unless otherwise noted.  DIAGNOSTIC FINDINGS:  None recent  PATIENT SURVEYS:  Modified Oswestry 42%   COGNITION: Overall cognitive status: Within functional limits for tasks assessed  SENSATION: WFL  POSTURE: rounded shoulders, forward head, anterior pelvic tilt, and obesity  PALPATION: TTP over iliac crest  and gluteals on right.  Bil low back tissue tension and TTP  R> L    LUMBAR SPECIAL TESTS:  Straight leg raise test: Negative and Slump test: Negative     LUMBAR ROM:   AROM eval  Flexion Fingertips to midshin  Extension 50% with pain  Right lateral flexion WNL  Left lateral flexion WNL  Right rotation WNL  Left rotation WNL   (Blank rows = not tested)  LOWER EXTREMITY ROM:     Active  Right eval Left eval  Hip flexion 90 supine 90 supine  Hip extension    Hip abduction    Hip adduction    Hip internal rotation 19 22  Hip external rotation    Knee flexion    Knee extension    Ankle dorsiflexion    Ankle plantarflexion    Ankle inversion    Ankle eversion     (Blank rows = not tested)  LOWER EXTREMITY MMT:    MMT Right eval Left eval  Hip flexion 4+ 4+  Hip extension 4 4  Hip abduction 4 4  Hip adduction    Hip internal rotation    Hip external rotation    Knee flexion 5 5  Knee extension 5 5  Ankle dorsiflexion    Ankle plantarflexion    Ankle inversion    Ankle eversion     (Blank rows = not tested)   (Blank rows = not tested) (Key: WFL = within functional limits not formally assessed, * = concordant pain, s = stiffness/stretching sensation, NT = not tested)  Comments:     FUNCTIONAL TESTS:  5 times sit to stand: 13.23 sec   TREATMENT DATE: 08-01-23  Eval and issue HEP  PATIENT EDUCATION:  Education details: POC Explanation of findings, issue HEP, posture initial education Person educated: Patient Education method: Explanation, Demonstration, Tactile cues, Verbal cues, and Handouts Education comprehension: verbalized understanding, returned demonstration, verbal cues required, tactile cues required, and needs further education  HOME EXERCISE PROGRAM: Access Code: [email or web address] URL:  [email or web address] Date: 08/01/2023 Prepared by: Garen Lah  Exercises - Supine Pelvic Tilt  - 1 x daily - 7 x weekly - 3 sets - 10 reps - Supine Single Knee to Chest Stretch  - 2 x daily - 7 x weekly - 1 sets - 5 reps - 10 hold - Supine Lower Trunk Rotation  - 2 x daily - 7 x weekly - 1 sets - 5 reps - 20 hold - Supine Hamstring Stretch with Strap  - 2 x daily - 7 x weekly - 1 sets - 3 reps - 20-46msec hold - Supine Bridge with Mini Swiss Ball Between Knees  - 1 x daily - 7 x weekly - 3 sets - 10 reps  ASSESSMENT:  CLINICAL IMPRESSION: Patient is a 33 y.o. female who was seen today for physical therapy evaluation and treatment for breast hypertrophy and low back pain and thoracic pain.  Pt has had pain since middle school but pain has increased to 10/10 in last 6 months.  Pt complains of difficulty sleeping , sitting and standing and walking for longer than 30 minutes causes 10/10 pain in low back and radiating into R buttock. Pt will benefit from skilled PT to address impairments.  OBJECTIVE IMPAIRMENTS: decreased activity tolerance, decreased mobility, difficulty walking, decreased ROM, decreased strength, postural dysfunction, obesity, and pain.   ACTIVITY LIMITATIONS: lifting, bending, sitting, standing, squatting, sleeping, stairs, and locomotion level  PARTICIPATION LIMITATIONS: meal prep, cleaning, laundry, and driving  PERSONAL FACTORS: L ankle fx > 5 years, obesity,  are also affecting patient's functional outcome.   REHAB POTENTIAL: Good  CLINICAL DECISION MAKING: Evolving/moderate complexity  EVALUATION COMPLEXITY: Moderate   GOALS: Goals reviewed with patient? Yes  SHORT TERM GOALS: Target date: 07-28-23  Pt will be independent with initial HEP Baseline:  Goal status: INITIAL  2.  Pt will demonstrate extension lumbar AROM in order to demonstrate improved tolerance to functional movement patterns.  Baseline: limited 50% Goal status:  INITIAL  3.  Demonstrate understanding of neutral posture and be more conscious of position and posture throughout the day.  Baseline:  Goal status: INITIAL   LONG TERM GOALS: Target date: 09-15-23  Pt will be able to perform advanced HEP and be able to demonstrate proper lifting Baseline: no knowledge Goal status: INITIAL  2.  Pt will be able to report sleeping more than 4 hours of uninterrupted sleep for more restorative rest Baseline: can only sleep on sides for 3 hours max Goal status: INITIAL  3.  Pt will be able to stand for 1 hour with minimal pain in order to perform job/household chore duties Baseline: Pain 10/10 Goal status: INITIAL  4.  .  Pt will demonstrate back/LE MMT of dead lift  25# in order to demonstrate improved strength for functional movements.  Baseline: See MMT chart Goal status: INITIAL  5.  Pt will tolerate sitting 1 hour in order to watch a show minimal to  pain Baseline: can only sit 30 min with 10/10 Goal status: INITIAL  6.  will show a >/= 12 pt improvement in their ODI score (MCID is 12 pts) as a proxy for functional improvement Baseline: 42% Goal status: INITIAL  PLAN:  PT FREQUENCY: 1-2x/week  PT DURATION: 6 weeks  PLANNED INTERVENTIONS: 97164- PT Re-evaluation, 97110-Therapeutic exercises, 97530- Therapeutic activity, 97112- Neuromuscular re-education, 97535- Self Care, 16109- Manual therapy, [phone]- Gait training, [phone]- Electrical stimulation (manual), Patient/Family education, Stair training, Taping, Dry Needling, Joint mobilization, Spinal mobilization, Cryotherapy, and Moist heat  PLAN FOR NEXT SESSION: TPDN,  HEP, Manual, posture education, sleep    Garen Lah, PT, ATRIC Certified Exercise Expert for the Aging Adult  08/01/23 11:58 AM Phone: [phone] Fax: [phone]   For all possible CPT codes, reference the Planned Interventions line above.     Check all conditions that are expected to impact treatment:  {Conditions expected to impact treatment:Morbid obesity   If treatment provided at initial evaluation, no treatment charged due to lack of authorization.

## 2023-08-01 ENCOUNTER — Ambulatory Visit: Payer: Medicaid Other | Attending: Student | Admitting: Physical Therapy

## 2023-08-01 ENCOUNTER — Other Ambulatory Visit: Payer: Self-pay

## 2023-08-01 DIAGNOSIS — M6281 Muscle weakness (generalized): Secondary | ICD-10-CM | POA: Diagnosis present

## 2023-08-01 DIAGNOSIS — R293 Abnormal posture: Secondary | ICD-10-CM | POA: Insufficient documentation

## 2023-08-01 DIAGNOSIS — M5441 Lumbago with sciatica, right side: Secondary | ICD-10-CM | POA: Diagnosis present

## 2023-08-07 ENCOUNTER — Encounter: Payer: Self-pay | Admitting: Physical Therapy

## 2023-08-07 ENCOUNTER — Ambulatory Visit: Payer: Medicaid Other | Admitting: Physical Therapy

## 2023-08-07 DIAGNOSIS — M5441 Lumbago with sciatica, right side: Secondary | ICD-10-CM | POA: Diagnosis not present

## 2023-08-07 DIAGNOSIS — M6281 Muscle weakness (generalized): Secondary | ICD-10-CM

## 2023-08-07 DIAGNOSIS — R293 Abnormal posture: Secondary | ICD-10-CM

## 2023-08-07 NOTE — Therapy (Signed)
 OUTPATIENT PHYSICAL THERAPY THORACOLUMBAR TREATMENT   Patient Name: Alice Frye MRN: [id number] DOB:1990-07-06, 33 y.o., female Today's Date: 08/07/2023  END OF SESSION:  PT End of Session - 08/07/23 1550     Visit Number 2    Number of Visits 12    Date for PT Re-Evaluation 09/15/23    Authorization Type [place] [employer]    PT Start Time 1543    PT Stop Time 1630    PT Time Calculation (min) 47 min    Activity Tolerance Patient tolerated treatment well    Behavior During Therapy [facility] for tasks assessed/performed              History reviewed. No pertinent past medical history. Past Surgical History:  Procedure Laterality Date   CESAREAN SECTION     Patient Active Problem List   Diagnosis Date Noted   VBAC (vaginal birth after Cesarean) 05/25/2019   Nexplanon in place 05/25/2019   PROM (premature rupture of membranes) 05/23/2019   History of cesarean section 05/23/2019   History of VBAC 05/23/2019   Labor and delivery indication for care or intervention 06/21/2014   Ruptured, membranes, premature 06/21/2014   Injury of ankle, left 04/24/2013   Insertion of Nexplanon 08/29/2012   Vaginosis 08/15/2012   General counseling and advice on female contraception 01/04/2012   OBESITY 05/13/2008    PCP: Department, [facility]   REFERRING PROVIDER: Harlen Labs, NP   REFERRING DIAG: thoracic back pain,  (M54.6 Back pain), hypertrophy of breast  THERAPY DIAG:  Bilateral low back pain with right-sided sciatica, unspecified chronicity  Muscle weakness (generalized)  Abnormal posture  Rationale for Evaluation and Treatment: Rehabilitation  ONSET DATE: Pt reports back pain since middle school but worse in the last 6 months  SUBJECTIVE:                                                                                                                                                                                                          SUBJECTIVE STATEMENT: I don't have any pain. I went and got a ball at 5 and below.  I am doing my exercises   EVAL - Pt reports back pain since middle school and I just never did anything about it.  My pain has gotten worse in the last 6 months. I am not able to exercise because of pain with my breasts and how affects my back.  I would like to get healthier and walking. I am trying to walk 30-45 minutes. I tried to do do  weights at home but I ended up in the hospital. I did not know what to do.  I can't sit for more than 30 minutes.   Hand dominance: Right  PERTINENT HISTORY:  L ankle fx > 5 years, obesity,   PAIN:  Are you having pain? Yes: NPRS scale: without meds  10/10 and at rest now 0/10 sitting but doing chores for more than 30 minutes 10/10  Pain location: low back Pain description: sharp pain in low back and sometimes goes down to my Right buttocks Aggravating factors: sitting for more than 30 minutes. Have to sleep on my sides and constantly at night for only 3-4 hours, laundry,and bending over,  cleaning the tub going up and down steps Relieving factors: pain meds  PRECAUTIONS: None  RED FLAGS: None     WEIGHT BEARING RESTRICTIONS: No  FALLS:  Has patient fallen in last 6 months? Yes. Number of falls 1  going down steps and tripped and fell last week  LIVING ENVIRONMENT: Lives with: lives with their family Lives in: House/apartment Stairs:  no stairs in home but has pain on stairs Has following equipment at home: None  OCCUPATION: Geologist, engineering  PLOF: Independent  PATIENT GOALS: Goals to learn exercises and help my pain and be able to exercise  NEXT MD VISIT: TBD  OBJECTIVE:  Note: Objective measures were completed at Evaluation unless otherwise noted.  DIAGNOSTIC FINDINGS:  None recent  PATIENT SURVEYS:  Modified Oswestry 42%   COGNITION: Overall cognitive status: Within functional limits for tasks assessed  SENSATION: WFL  POSTURE: rounded  shoulders, forward head, anterior pelvic tilt, and obesity  PALPATION: TTP over iliac crest and gluteals on right.  Bil low back tissue tension and TTP  R> L    LUMBAR SPECIAL TESTS:  Straight leg raise test: Negative and Slump test: Negative     LUMBAR ROM:   AROM eval  Flexion Fingertips to midshin  Extension 50% with pain  Right lateral flexion WNL  Left lateral flexion WNL  Right rotation WNL  Left rotation WNL   (Blank rows = not tested)  LOWER EXTREMITY ROM:     Active  Right eval Left eval  Hip flexion 90 supine 90 supine  Hip extension    Hip abduction    Hip adduction    Hip internal rotation 19 22  Hip external rotation    Knee flexion    Knee extension    Ankle dorsiflexion    Ankle plantarflexion    Ankle inversion    Ankle eversion     (Blank rows = not tested)  LOWER EXTREMITY MMT:    MMT Right eval Left eval  Hip flexion 4+ 4+  Hip extension 4 4  Hip abduction 4 4  Hip adduction    Hip internal rotation    Hip external rotation    Knee flexion 5 5  Knee extension 5 5  Ankle dorsiflexion    Ankle plantarflexion    Ankle inversion    Ankle eversion     (Blank rows = not tested)   (Blank rows = not tested) (Key: WFL = within functional limits not formally assessed, * = concordant pain, s = stiffness/stretching sensation, NT = not tested)  Comments:     FUNCTIONAL TESTS:  5 times sit to stand: 13.23 sec     [facility] Adult PT Treatment:  DATE: 08-07-23 Therapeutic Exercise: Prone I, Y and T with 1 lb DB  3 x 10 BTB bil shld extension  3 x 10 ER shoulder with BTB 3 x 10  PPT LTR SKTC Bridge with ball 3 x 10 Supine march Sidelying resisted clam Squat to chair with 10 # DB  3 x 10  TREATMENT DATE: 08-01-23  Eval and issue HEP                                                                                                                                 PATIENT EDUCATION:  Education  details: POC Explanation of findings, issue HEP, posture initial education Person educated: Patient Education method: Explanation, Demonstration, Tactile cues, Verbal cues, and Handouts Education comprehension: verbalized understanding, returned demonstration, verbal cues required, tactile cues required, and needs further education  HOME EXERCISE PROGRAM: Access Code: [email or web address] URL: [email or web address] Date: 08/01/2023 Prepared by: Garen Lah  Exercises - Supine Pelvic Tilt  - 1 x daily - 7 x weekly - 3 sets - 10 reps - Supine Single Knee to Chest Stretch  - 2 x daily - 7 x weekly - 1 sets - 5 reps - 10 hold - Supine Lower Trunk Rotation  - 2 x daily - 7 x weekly - 1 sets - 5 reps - 20 hold - Supine Hamstring Stretch with Strap  - 2 x daily - 7 x weekly - 1 sets - 3 reps - 20-8msec hold - Supine Bridge with Mini Swiss Ball Between Knees  - 1 x daily - 7 x weekly - 3 sets - 10 reps Added 08-07-23  - Supine March  - 1 x daily - 7 x weekly - 3 sets - 10 reps - Squat with Chair Touch  - 1 x daily - 7 x weekly - 3 sets - 10 reps - Prone Scapular Retraction Y  - 1 x daily - 7 x weekly - 3 sets - 10 reps - Prone Shoulder Horizontal Abduction with Thumbs Up  - 1 x daily - 7 x weekly - 3 sets - 10 reps - Prone Shoulder Extension  - 1 x daily - 7 x weekly - 3 sets - 10 reps ASSESSMENT:  CLINICAL IMPRESSION: Pt returns to clinic with no pain and she is enthusiastic about participating and getting stronger in PT.  Pt HEP updated for subscapular stabilizer exercises and strengthening for abdominal core and  back strength.  Will continue to progress toward goal completion.   EVAL- Patient is a 33 y.o. female who was seen today for physical therapy evaluation and treatment for breast hypertrophy and low back pain and thoracic pain.  Pt has had pain since middle school but pain has increased to 10/10 in last 6 months.  Pt complains of difficulty sleeping , sitting and standing and  walking for longer than 30 minutes causes 10/10 pain in low back and radiating into R buttock. Pt will benefit  from skilled PT to address impairments.  OBJECTIVE IMPAIRMENTS: decreased activity tolerance, decreased mobility, difficulty walking, decreased ROM, decreased strength, postural dysfunction, obesity, and pain.   ACTIVITY LIMITATIONS: lifting, bending, sitting, standing, squatting, sleeping, stairs, and locomotion level  PARTICIPATION LIMITATIONS: meal prep, cleaning, laundry, and driving  PERSONAL FACTORS: L ankle fx > 5 years, obesity,  are also affecting patient's functional outcome.   REHAB POTENTIAL: Good  CLINICAL DECISION MAKING: Evolving/moderate complexity  EVALUATION COMPLEXITY: Moderate   GOALS: Goals reviewed with patient? Yes  SHORT TERM GOALS: Target date: 07-28-23  Pt will be independent with initial HEP Baseline:  Goal status: INITIAL  2.  Pt will demonstrate extension lumbar AROM in order to demonstrate improved tolerance to functional movement patterns.  Baseline: limited 50% Goal status: INITIAL  3.  Demonstrate understanding of neutral posture and be more conscious of position and posture throughout the day.  Baseline:  Goal status: INITIAL   LONG TERM GOALS: Target date: 09-15-23  Pt will be able to perform advanced HEP and be able to demonstrate proper lifting Baseline: no knowledge Goal status: INITIAL  2.  Pt will be able to report sleeping more than 4 hours of uninterrupted sleep for more restorative rest Baseline: can only sleep on sides for 3 hours max Goal status: INITIAL  3.  Pt will be able to stand for 1 hour with minimal pain in order to perform job/household chore duties Baseline: Pain 10/10 Goal status: INITIAL  4.  .  Pt will demonstrate back/LE MMT of dead lift  25# in order to demonstrate improved strength for functional movements.  Baseline: See MMT chart Goal status: INITIAL  5.  Pt will tolerate sitting 1 hour in order  to watch a show minimal to  pain Baseline: can only sit 30 min with 10/10 Goal status: INITIAL  6.  will show a >/= 12 pt improvement in their ODI score (MCID is 12 pts) as a proxy for functional improvement Baseline: 42% Goal status: INITIAL   PLAN:  PT FREQUENCY: 1-2x/week  PT DURATION: 6 weeks  PLANNED INTERVENTIONS: 97164- PT Re-evaluation, 97110-Therapeutic exercises, 97530- Therapeutic activity, 97112- Neuromuscular re-education, 97535- Self Care, 01601- Manual therapy, 97116- Gait training, [phone]- Electrical stimulation (manual), Patient/Family education, Stair training, Taping, Dry Needling, Joint mobilization, Spinal mobilization, Cryotherapy, and Moist heat  PLAN FOR NEXT SESSION: TPDN,  HEP, Manual, posture education, sleep    Garen Lah, PT, [facility] Certified Exercise Expert for the Aging Adult  08/07/23 4:45 PM Phone: [phone] Fax: [phone]   For all possible CPT codes, reference the Planned Interventions line above.     Check all conditions that are expected to impact treatment: {Conditions expected to impact treatment:Morbid obesity   If treatment provided at initial evaluation, no treatment charged due to lack of authorization.

## 2023-08-08 NOTE — Therapy (Signed)
 OUTPATIENT PHYSICAL THERAPY THORACOLUMBAR TREATMENT   Patient Name: Alice Frye MRN: [id number] DOB:04/23/1991, 33 y.o., female Today's Date: 08/09/2023  END OF SESSION:  PT End of Session - 08/09/23 1423     Visit Number 3    Number of Visits 12    Date for PT Re-Evaluation 09/15/23    Authorization Type [place] [employer]    PT Start Time 1416    PT Stop Time 1500    PT Time Calculation (min) 44 min    Activity Tolerance Patient tolerated treatment well    Behavior During Therapy [facility] for tasks assessed/performed               History reviewed. No pertinent past medical history. Past Surgical History:  Procedure Laterality Date   CESAREAN SECTION     Patient Active Problem List   Diagnosis Date Noted   VBAC (vaginal birth after Cesarean) 05/25/2019   Nexplanon in place 05/25/2019   PROM (premature rupture of membranes) 05/23/2019   History of cesarean section 05/23/2019   History of VBAC 05/23/2019   Labor and delivery indication for care or intervention 06/21/2014   Ruptured, membranes, premature 06/21/2014   Injury of ankle, left 04/24/2013   Insertion of Nexplanon 08/29/2012   Vaginosis 08/15/2012   General counseling and advice on female contraception 01/04/2012   OBESITY 05/13/2008    PCP: [facility]   REFERRING PROVIDER: Harlen Labs, NP   REFERRING DIAG: thoracic back pain,  (M54.6 Back pain), hypertrophy of breast  THERAPY DIAG:  Bilateral low back pain with right-sided sciatica, unspecified chronicity  Muscle weakness (generalized)  Abnormal posture  Rationale for Evaluation and Treatment: Rehabilitation  ONSET DATE: Pt reports back pain since middle school but worse in the last 6 months  SUBJECTIVE:                                                                                                                                                                                                          SUBJECTIVE STATEMENT:  No pain today. I am doing my exercises at home   EVAL - Pt reports back pain since middle school and I just never did anything about it.  My pain has gotten worse in the last 6 months. I am not able to exercise because of pain with my breasts and how affects my back.  I would like to get healthier and walking. I am trying to walk 30-45 minutes. I tried to do do weights at home but I ended up in the  hospital. I did not know what to do.  I can't sit for more than 30 minutes.   Hand dominance: Right  PERTINENT HISTORY:  L ankle fx > 5 years, obesity,   PAIN:  Are you having pain? Yes: NPRS scale: without meds  10/10 and at rest now 0/10 sitting but doing chores for more than 30 minutes 10/10  Pain location: low back Pain description: sharp pain in low back and sometimes goes down to my Right buttocks Aggravating factors: sitting for more than 30 minutes. Have to sleep on my sides and constantly at night for only 3-4 hours, laundry,and bending over,  cleaning the tub going up and down steps Relieving factors: pain meds  PRECAUTIONS: None  RED FLAGS: None     WEIGHT BEARING RESTRICTIONS: No  FALLS:  Has patient fallen in last 6 months? Yes. Number of falls 1  going down steps and tripped and fell last week  LIVING ENVIRONMENT: Lives with: lives with their family Lives in: House/apartment Stairs:  no stairs in home but has pain on stairs Has following equipment at home: None  OCCUPATION: Geologist, engineering  PLOF: Independent  PATIENT GOALS: Goals to learn exercises and help my pain and be able to exercise  NEXT MD VISIT: TBD  OBJECTIVE:  Note: Objective measures were completed at Evaluation unless otherwise noted.  DIAGNOSTIC FINDINGS:  None recent  PATIENT SURVEYS:  Modified Oswestry 42%   COGNITION: Overall cognitive status: Within functional limits for tasks assessed  SENSATION: WFL  POSTURE: rounded shoulders, forward head, anterior  pelvic tilt, and obesity  PALPATION: TTP over iliac crest and gluteals on right.  Bil low back tissue tension and TTP  R> L    LUMBAR SPECIAL TESTS:  Straight leg raise test: Negative and Slump test: Negative     LUMBAR ROM:   AROM eval  Flexion Fingertips to midshin  Extension 50% with pain  Right lateral flexion WNL  Left lateral flexion WNL  Right rotation WNL  Left rotation WNL   (Blank rows = not tested)  LOWER EXTREMITY ROM:     Active  Right eval Left eval  Hip flexion 90 supine 90 supine  Hip extension    Hip abduction    Hip adduction    Hip internal rotation 19 22  Hip external rotation    Knee flexion    Knee extension    Ankle dorsiflexion    Ankle plantarflexion    Ankle inversion    Ankle eversion     (Blank rows = not tested)  LOWER EXTREMITY MMT:    MMT Right eval Left eval  Hip flexion 4+ 4+  Hip extension 4 4  Hip abduction 4 4  Hip adduction    Hip internal rotation    Hip external rotation    Knee flexion 5 5  Knee extension 5 5  Ankle dorsiflexion    Ankle plantarflexion    Ankle inversion    Ankle eversion     (Blank rows = not tested)   (Blank rows = not tested) (Key: WFL = within functional limits not formally assessed, * = concordant pain, s = stiffness/stretching sensation, NT = not tested)  Comments:     FUNCTIONAL TESTS:  5 times sit to stand: 13.23 sec    [facility] Adult PT Treatment:  DATE: 08-08-23 Therapeutic Exercise: UBE  2.5 level    2.5 min forward and backward while taking subjective Standing scaption bil with 5lb DB 2 x 10 Standing scaption bil with 7 lb DB  4 x only with fatigue and start of upper thoracic spasm Standing bil shld abduction 3 x 10 5 lb DB Sidelying clamshell  2 x 10 on R and L Bridge with ball  2 x 10 Bear plank  10 sec hold x 5  Therapeutic Activity: Step ups on 6 inch step with 5 lb DB each hand  3 x 10  Lateral step up with 10 lb weight  on R 3 x 10 Lateral step up with 10 lb weight on L 3 x 10 STS with 15 # KB 3 x 10  Self Care: Sleep Hygiene  [facility] Adult PT Treatment:                                                DATE: 08-07-23 Therapeutic Exercise: Prone I, Y and T with 1 lb DB  3 x 10 BTB bil shld extension  3 x 10 ER shoulder with BTB 3 x 10  PPT LTR SKTC Bridge with ball 3 x 10 Supine march Sidelying resisted clam Squat to chair with 10 # DB  3 x 10  TREATMENT DATE: 08-01-23  Eval and issue HEP                                                                                                                                 PATIENT EDUCATION:  Education details: POC Explanation of findings, issue HEP, posture initial education Person educated: Patient Education method: Explanation, Demonstration, Tactile cues, Verbal cues, and Handouts Education comprehension: verbalized understanding, returned demonstration, verbal cues required, tactile cues required, and needs further education  HOME EXERCISE PROGRAM: Access Code: [email or web address] URL: [email or web address] Date: 08/01/2023 Prepared by: Garen Lah  Exercises - Supine Pelvic Tilt  - 1 x daily - 7 x weekly - 3 sets - 10 reps - Supine Single Knee to Chest Stretch  - 2 x daily - 7 x weekly - 1 sets - 5 reps - 10 hold - Supine Lower Trunk Rotation  - 2 x daily - 7 x weekly - 1 sets - 5 reps - 20 hold - Supine Hamstring Stretch with Strap  - 2 x daily - 7 x weekly - 1 sets - 3 reps - 20-64msec hold - Supine Bridge with Mini Swiss Ball Between Knees  - 1 x daily - 7 x weekly - 3 sets - 10 reps Added 08-07-23  - Supine March  - 1 x daily - 7 x weekly - 3 sets - 10 reps - Squat with Chair Touch  - 1 x daily - 7 x weekly - 3 sets -  10 reps - Prone Scapular Retraction Y  - 1 x daily - 7 x weekly - 3 sets - 10 reps - Prone Shoulder Horizontal Abduction with Thumbs Up  - 1 x daily - 7 x weekly - 3 sets - 10 reps - Prone Shoulder Extension  - 1 x daily  - 7 x weekly - 3 sets - 10 reps  Added 08-09-23 - Bear Plank from Quadruped  - 1 x daily - 7 x weekly - 1 sets - 5 reps - 10-30 sec hold ASSESSMENT:  CLINICAL IMPRESSION: Pt returns to clinic with no pain and session today adding resistance to exercises for more challenge.  Developing HEP for home use for thoracic and back.  Pt is motivated to learn and to incorporate more challenging exercises into HEP. Added bear plank to HEP.   Will continue to progress toward goal completion.   EVAL- Patient is a 33 y.o. female who was seen today for physical therapy evaluation and treatment for breast hypertrophy and low back pain and thoracic pain.  Pt has had pain since middle school but pain has increased to 10/10 in last 6 months.  Pt complains of difficulty sleeping , sitting and standing and walking for longer than 30 minutes causes 10/10 pain in low back and radiating into R buttock. Pt will benefit from skilled PT to address impairments.  OBJECTIVE IMPAIRMENTS: decreased activity tolerance, decreased mobility, difficulty walking, decreased ROM, decreased strength, postural dysfunction, obesity, and pain.   ACTIVITY LIMITATIONS: lifting, bending, sitting, standing, squatting, sleeping, stairs, and locomotion level  PARTICIPATION LIMITATIONS: meal prep, cleaning, laundry, and driving  PERSONAL FACTORS: L ankle fx > 5 years, obesity,  are also affecting patient's functional outcome.   REHAB POTENTIAL: Good  CLINICAL DECISION MAKING: Evolving/moderate complexity  EVALUATION COMPLEXITY: Moderate   GOALS: Goals reviewed with patient? Yes  SHORT TERM GOALS: Target date: 07-28-23  Pt will be independent with initial HEP Baseline:  Goal status: INITIAL  2.  Pt will demonstrate extension lumbar AROM in order to demonstrate improved tolerance to functional movement patterns.  Baseline: limited 50% Goal status: INITIAL  3.  Demonstrate understanding of neutral posture and be more conscious of  position and posture throughout the day.  Baseline:  Goal status: INITIAL   LONG TERM GOALS: Target date: 09-15-23  Pt will be able to perform advanced HEP and be able to demonstrate proper lifting Baseline: no knowledge Goal status: INITIAL  2.  Pt will be able to report sleeping more than 4 hours of uninterrupted sleep for more restorative rest Baseline: can only sleep on sides for 3 hours max Goal status: INITIAL  3.  Pt will be able to stand for 1 hour with minimal pain in order to perform job/household chore duties Baseline: Pain 10/10 Goal status: INITIAL  4.  .  Pt will demonstrate back/LE MMT of dead lift  25# in order to demonstrate improved strength for functional movements.  Baseline: See MMT chart Goal status: INITIAL  5.  Pt will tolerate sitting 1 hour in order to watch a show minimal to  pain Baseline: can only sit 30 min with 10/10 Goal status: INITIAL  6.  will show a >/= 12 pt improvement in their ODI score (MCID is 12 pts) as a proxy for functional improvement Baseline: 42% Goal status: INITIAL   PLAN:  PT FREQUENCY: 1-2x/week  PT DURATION: 6 weeks  PLANNED INTERVENTIONS: 97164- PT Re-evaluation, 97110-Therapeutic exercises, 97530- Therapeutic activity, [id number]- Neuromuscular re-education, 97535- Self  Care, 78295- Manual therapy, [phone]- Gait training, [phone]- Electrical stimulation (manual), Patient/Family education, Stair training, Taping, Dry Needling, Joint mobilization, Spinal mobilization, Cryotherapy, and Moist heat  PLAN FOR NEXT SESSION: TPDN,  HEP, Manual, posture education, sleep    Garen Lah, PT, [facility] Certified Exercise Expert for the Aging Adult  08/09/23 2:59 PM Phone: [phone] Fax: [phone]   For all possible CPT codes, reference the Planned Interventions line above.     Check all conditions that are expected to impact treatment: {Conditions expected to impact treatment:Morbid obesity   If treatment provided at initial  evaluation, no treatment charged due to lack of authorization.

## 2023-08-09 ENCOUNTER — Ambulatory Visit: Payer: Medicaid Other | Admitting: Physical Therapy

## 2023-08-09 ENCOUNTER — Encounter: Payer: Self-pay | Admitting: Physical Therapy

## 2023-08-09 DIAGNOSIS — M6281 Muscle weakness (generalized): Secondary | ICD-10-CM

## 2023-08-09 DIAGNOSIS — M5441 Lumbago with sciatica, right side: Secondary | ICD-10-CM

## 2023-08-09 DIAGNOSIS — R293 Abnormal posture: Secondary | ICD-10-CM

## 2023-08-09 NOTE — Patient Instructions (Signed)
 Sleep Tips  from Alice Frye  Why we Sleep book   Keep a consistent sleep schedule. Get up at the same time every day, even on weekends or during vacations. Set a bedtime that is early enough for you to get at least 7 hours of sleep. Don't go to bed unless you are sleepy.  If you don't fall asleep after 20 minutes, get out of bed.  Establish a relaxing bedtime routine.  Use your bed only for sleep and sex.  Make your bedroom quiet and relaxing. Keep the room at a comfortable, cool temperature. 65 degrees Limit exposure to bright light in the evenings. Turn off electronic devices at least 30 minutes before bedtime. Don't eat a large meal before bedtime. If you are hungry at night, eat a light, healthy snack.  Exercise regularly and maintain a healthy diet.  Avoid consuming caffeine in the late afternoon or evening.  Avoid consuming alcohol before bedtime.  Reduce your fluid intake before bedtime.  Alice Frye, PT, [facility] Certified Exercise Expert for the Aging Adult  08/09/23 2:43 PM Phone: [phone] Fax: [phone]

## 2023-08-14 ENCOUNTER — Ambulatory Visit: Payer: Medicaid Other | Attending: Student | Admitting: Physical Therapy

## 2023-08-14 DIAGNOSIS — R293 Abnormal posture: Secondary | ICD-10-CM | POA: Insufficient documentation

## 2023-08-14 DIAGNOSIS — M6281 Muscle weakness (generalized): Secondary | ICD-10-CM | POA: Diagnosis present

## 2023-08-14 DIAGNOSIS — M5441 Lumbago with sciatica, right side: Secondary | ICD-10-CM | POA: Diagnosis present

## 2023-08-14 NOTE — Therapy (Signed)
 OUTPATIENT PHYSICAL THERAPY THORACOLUMBAR TREATMENT   Patient Name: Alice Frye MRN: [id number] DOB:1991-04-19, 33 y.o., female Today's Date: 08/14/2023  END OF SESSION:  PT End of Session - 08/14/23 1553     Visit Number 4    Number of Visits 12    Date for PT Re-Evaluation 09/15/23    Authorization Type [place] [employer]    PT Start Time 1501    PT Stop Time 1545    PT Time Calculation (min) 44 min    Activity Tolerance Patient tolerated treatment well    Behavior During Therapy [facility] for tasks assessed/performed                No past medical history on file. Past Surgical History:  Procedure Laterality Date   CESAREAN SECTION     Patient Active Problem List   Diagnosis Date Noted   VBAC (vaginal birth after Cesarean) 05/25/2019   Nexplanon in place 05/25/2019   PROM (premature rupture of membranes) 05/23/2019   History of cesarean section 05/23/2019   History of VBAC 05/23/2019   Labor and delivery indication for care or intervention 06/21/2014   Ruptured, membranes, premature 06/21/2014   Injury of ankle, left 04/24/2013   Insertion of Nexplanon 08/29/2012   Vaginosis 08/15/2012   General counseling and advice on female contraception 01/04/2012   OBESITY 05/13/2008    PCP: Department, [facility]   REFERRING PROVIDER: Harlen Labs, NP   REFERRING DIAG: thoracic back pain,  (M54.6 Back pain), hypertrophy of breast  THERAPY DIAG:  Bilateral low back pain with right-sided sciatica, unspecified chronicity  Muscle weakness (generalized)  Abnormal posture  Rationale for Evaluation and Treatment: Rehabilitation  ONSET DATE: Pt reports back pain since middle school but worse in the last 6 months  SUBJECTIVE:                                                                                                                                                                                                         SUBJECTIVE STATEMENT:   No pain today. I am doing my exercises at home   EVAL - Pt reports back pain since middle school and I just never did anything about it.  My pain has gotten worse in the last 6 months. I am not able to exercise because of pain with my breasts and how affects my back.  I would like to get healthier and walking. I am trying to walk 30-45 minutes. I tried to do do weights at home but I ended up in the  hospital. I did not know what to do.  I can't sit for more than 30 minutes.   Hand dominance: Right  PERTINENT HISTORY:  L ankle fx > 5 years, obesity,   PAIN:  Are you having pain? Yes: NPRS scale: without meds  10/10 and at rest now 0/10 sitting but doing chores for more than 30 minutes 10/10  Pain location: low back Pain description: sharp pain in low back and sometimes goes down to my Right buttocks Aggravating factors: sitting for more than 30 minutes. Have to sleep on my sides and constantly at night for only 3-4 hours, laundry,and bending over,  cleaning the tub going up and down steps Relieving factors: pain meds  PRECAUTIONS: None  RED FLAGS: None     WEIGHT BEARING RESTRICTIONS: No  FALLS:  Has patient fallen in last 6 months? Yes. Number of falls 1  going down steps and tripped and fell last week  LIVING ENVIRONMENT: Lives with: lives with their family Lives in: House/apartment Stairs:  no stairs in home but has pain on stairs Has following equipment at home: None  OCCUPATION: Geologist, engineering  PLOF: Independent  PATIENT GOALS: Goals to learn exercises and help my pain and be able to exercise  NEXT MD VISIT: TBD  OBJECTIVE:  Note: Objective measures were completed at Evaluation unless otherwise noted.  DIAGNOSTIC FINDINGS:  None recent  PATIENT SURVEYS:  Modified Oswestry 42%   COGNITION: Overall cognitive status: Within functional limits for tasks assessed  SENSATION: WFL  POSTURE: rounded shoulders, forward head, anterior pelvic tilt, and  obesity  PALPATION: TTP over iliac crest and gluteals on right.  Bil low back tissue tension and TTP  R> L    LUMBAR SPECIAL TESTS:  Straight leg raise test: Negative and Slump test: Negative     LUMBAR ROM:   AROM eval 08-14-23  Flexion Fingertips to midshin   Extension 50% with pain 75%  Right lateral flexion WNL WNL  Left lateral flexion WNL WNL  Right rotation WNL WNL  Left rotation WNL WNL   (Blank rows = not tested)  LOWER EXTREMITY ROM:     Active  Right eval Left eval  Hip flexion 90 supine 90 supine  Hip extension    Hip abduction    Hip adduction    Hip internal rotation 19 22  Hip external rotation    Knee flexion    Knee extension    Ankle dorsiflexion    Ankle plantarflexion    Ankle inversion    Ankle eversion     (Blank rows = not tested)  LOWER EXTREMITY MMT:    MMT Right eval Left eval  Hip flexion 4+ 4+  Hip extension 4 4  Hip abduction 4 4  Hip adduction    Hip internal rotation    Hip external rotation    Knee flexion 5 5  Knee extension 5 5  Ankle dorsiflexion    Ankle plantarflexion    Ankle inversion    Ankle eversion     (Blank rows = not tested)   (Blank rows = not tested) (Key: WFL = within functional limits not formally assessed, * = concordant pain, s = stiffness/stretching sensation, NT = not tested)  Comments:     FUNCTIONAL TESTS:  5 times sit to stand: 13.23 sec   [facility] Adult PT Treatment:  DATE: 08-14-23 Therapeutic Exercise: Elliptical level 2 6 min UE/LE Prone I, Y T   10 x each with 2lb DB ( 3 rounds) Bent over row with 15 # KB on mat with opposite hand and knee Pull downs with UE BTB in door way  2 x 10  Therapeutic Activity: STS 2 x 15  15 # KB Demo of lifting 15 # Deadlift of 15 #  Self Care: Posture Body mechanics  Lifting techniques and demo  [facility] Adult PT Treatment:                                                DATE: 08-08-23 Therapeutic  Exercise: UBE  2.5 level    2.5 min forward and backward while taking subjective Standing scaption bil with 5lb DB 2 x 10 Standing scaption bil with 7 lb DB  4 x only with fatigue and start of upper thoracic spasm Standing bil shld abduction 3 x 10 5 lb DB Sidelying clamshell  2 x 10 on R and L Bridge with ball  2 x 10 Bear plank  10 sec hold x 5  Therapeutic Activity: Step ups on 6 inch step with 5 lb DB each hand  3 x 10  Lateral step up with 10 lb weight on R 3 x 10 Lateral step up with 10 lb weight on L 3 x 10 STS with 15 # KB 3 x 10  Self Care: Sleep Hygiene  [facility] Adult PT Treatment:                                                DATE: 08-07-23 Therapeutic Exercise: Prone I, Y and T with 1 lb DB  3 x 10 BTB bil shld extension  3 x 10 ER shoulder with BTB 3 x 10  PPT LTR SKTC Bridge with ball 3 x 10 Supine march Sidelying resisted clam Squat to chair with 10 # DB  3 x 10  TREATMENT DATE: 08-01-23  Eval and issue HEP                                                                                                                                 PATIENT EDUCATION:  Education details: POC Explanation of findings, issue HEP, posture initial education Person educated: Patient Education method: Explanation, Demonstration, Tactile cues, Verbal cues, and Handouts Education comprehension: verbalized understanding, returned demonstration, verbal cues required, tactile cues required, and needs further education  HOME EXERCISE PROGRAM: Access Code: [email or web address] URL: [email or web address] Date: 08/01/2023 Prepared by: Garen Lah  Exercises - Supine Pelvic Tilt  - 1 x daily - 7 x weekly - 3 sets - 10 reps -  Supine Single Knee to Chest Stretch  - 2 x daily - 7 x weekly - 1 sets - 5 reps - 10 hold - Supine Lower Trunk Rotation  - 2 x daily - 7 x weekly - 1 sets - 5 reps - 20 hold - Supine Hamstring Stretch with Strap  - 2 x daily - 7 x weekly - 1 sets - 3 reps -  20-70msec hold - Supine Bridge with Mini Swiss Ball Between Knees  - 1 x daily - 7 x weekly - 3 sets - 10 reps Added 08-07-23  - Supine March  - 1 x daily - 7 x weekly - 3 sets - 10 reps - Squat with Chair Touch  - 1 x daily - 7 x weekly - 3 sets - 10 reps - Prone Scapular Retraction Y  - 1 x daily - 7 x weekly - 3 sets - 10 reps - Prone Shoulder Horizontal Abduction with Thumbs Up  - 1 x daily - 7 x weekly - 3 sets - 10 reps - Prone Shoulder Extension  - 1 x daily - 7 x weekly - 3 sets - 10 reps  Added 08-09-23 - Bear Plank from Quadruped  - 1 x daily - 7 x weekly - 1 sets - 5 reps - 10-30 sec hold ASSESSMENT:  CLINICAL IMPRESSION: Pt returns to clinic with no pain but does comment that her large breast prevent her from fully engaging in exercises, especially the prone position.  Use of pillows to help with discomfort in session.  Session concentrates on education of body mechanics and posture and lifting technique.  Use of increasing weights for resistance used.  Developing HEP for home use for thoracic and back. All STG met this visit.  Pt is motivated to learn and to incorporate more challenging exercises into HEP Will continue to progress toward goal completion.   EVAL- Patient is a 33 y.o. female who was seen today for physical therapy evaluation and treatment for breast hypertrophy and low back pain and thoracic pain.  Pt has had pain since middle school but pain has increased to 10/10 in last 6 months.  Pt complains of difficulty sleeping , sitting and standing and walking for longer than 30 minutes causes 10/10 pain in low back and radiating into R buttock. Pt will benefit from skilled PT to address impairments.  OBJECTIVE IMPAIRMENTS: decreased activity tolerance, decreased mobility, difficulty walking, decreased ROM, decreased strength, postural dysfunction, obesity, and pain.   ACTIVITY LIMITATIONS: lifting, bending, sitting, standing, squatting, sleeping, stairs, and locomotion  level  PARTICIPATION LIMITATIONS: meal prep, cleaning, laundry, and driving  PERSONAL FACTORS: L ankle fx > 5 years, obesity,  are also affecting patient's functional outcome.   REHAB POTENTIAL: Good  CLINICAL DECISION MAKING: Evolving/moderate complexity  EVALUATION COMPLEXITY: Moderate   GOALS: Goals reviewed with patient? Yes  SHORT TERM GOALS: Target date: 08-25-23 corrected  Pt will be independent with initial HEP Baseline:  Goal status: MET  2.  Pt will demonstrate extension lumbar AROM in order to demonstrate improved tolerance to functional movement patterns.  Baseline: limited 50% Goal status: MET  3.  Demonstrate understanding of neutral posture and be more conscious of position and posture throughout the day.  Baseline:  Goal status: MET   LONG TERM GOALS: Target date: 09-15-23  Pt will be able to perform advanced HEP and be able to demonstrate proper lifting Baseline: no knowledge Goal status: INITIAL  2.  Pt will be able to report  sleeping more than 4 hours of uninterrupted sleep for more restorative rest Baseline: can only sleep on sides for 3 hours max Goal status: INITIAL  3.  Pt will be able to stand for 1 hour with minimal pain in order to perform job/household chore duties Baseline: Pain 10/10 Goal status: INITIAL  4.  .  Pt will demonstrate back/LE MMT of dead lift  25# in order to demonstrate improved strength for functional movements.  Baseline: See MMT chart Goal status: INITIAL  5.  Pt will tolerate sitting 1 hour in order to watch a show minimal to  pain Baseline: can only sit 30 min with 10/10 Goal status: INITIAL  6.  will show a >/= 12 pt improvement in their ODI score (MCID is 12 pts) as a proxy for functional improvement Baseline: 42% Goal status: INITIAL   PLAN:  PT FREQUENCY: 1-2x/week  PT DURATION: 6 weeks  PLANNED INTERVENTIONS: 97164- PT Re-evaluation, 97110-Therapeutic exercises, 97530- Therapeutic activity, 97112-  Neuromuscular re-education, 97535- Self Care, 16109- Manual therapy, 97116- Gait training, [phone]- Electrical stimulation (manual), Patient/Family education, Stair training, Taping, Dry Needling, Joint mobilization, Spinal mobilization, Cryotherapy, and Moist heat  PLAN FOR NEXT SESSION: TPDN,  HEP, Manual, posture education, sleep    Garen Lah, PT, ATRIC Certified Exercise Expert for the Aging Adult  08/14/23 4:36 PM Phone: [phone] Fax: [phone]   For all possible CPT codes, reference the Planned Interventions line above.     Check all conditions that are expected to impact treatment: {Conditions expected to impact treatment:Morbid obesity   If treatment provided at initial evaluation, no treatment charged due to lack of authorization.

## 2023-08-14 NOTE — Patient Instructions (Signed)
 Sleeping on Back  Place pillow under knees. A pillow with cervical support and a roll around waist are also helpful. Copyright  VHI. All rights reserved.  Sleeping on Side Place pillow between knees. Use cervical support under neck and a roll around waist as needed. Copyright  VHI. All rights reserved.   Sleeping on Stomach   If this is the only desirable sleeping position, place pillow under lower legs, and under stomach or chest as needed.  Posture - Sitting   Sit upright, head facing forward. Try using a roll to support lower back. Keep shoulders relaxed, and avoid rounded back. Keep hips level with knees. Avoid crossing legs for long periods. Stand to Sit / Sit to Stand   To sit: Bend knees to lower self onto front edge of chair, then scoot back on seat. To stand: Reverse sequence by placing one foot forward, and scoot to front of seat. Use rocking motion to stand up.   Work Height and Reach  Ideal work height is no more than 2 to 4 inches below elbow level when standing, and at elbow level when sitting. Reaching should be limited to arm's length, with elbows slightly bent.  Bending  Bend at hips and knees, not back. Keep feet shoulder-width apart.    Posture - Standing   Good posture is important. Avoid slouching and forward head thrust. Maintain curve in low back and align ears over shoul- ders, hips over ankles.  Alternating Positions   Alternate tasks and change positions frequently to reduce fatigue and muscle tension. Take rest breaks. Computer Work   Position work to Art gallery manager. Use proper work and seat height. Keep shoulders back and down, wrists straight, and elbows at right angles. Use chair that provides full back support. Add footrest and lumbar roll as needed.  Getting Into / Out of Car  Lower self onto seat, scoot back, then bring in one leg at a time. Reverse sequence to get out.  Dressing  Lie on back to pull socks or slacks over feet, or sit  and bend leg while keeping back straight.    Housework - Sink  Place one foot on ledge of cabinet under sink when standing at sink for prolonged periods.   Pushing / Pulling  Pushing is preferable to pulling. Keep back in proper alignment, and use leg muscles to do the work.  Deep Squat   Squat and lift with both arms held against upper trunk. Tighten stomach muscles without holding breath. Use smooth movements to avoid jerking.  Avoid Twisting   Avoid twisting or bending back. Pivot around using foot movements, and bend at knees if needed when reaching for articles.  Carrying Luggage   Distribute weight evenly on both sides. Use a cart whenever possible. Do not twist trunk. Move body as a unit.   Lifting Principles Maintain proper posture and head alignment. Slide object as close as possible before lifting. Move obstacles out of the way. Test before lifting; ask for help if too heavy. Tighten stomach muscles without holding breath. Use smooth movements; do not jerk. Use legs to do the work, and pivot with feet. Distribute the work load symmetrically and close to the center of trunk. Push instead of pull whenever possible.   Ask For Help   Ask for help and delegate to others when possible. Coordinate your movements when lifting together, and maintain the low back curve.  Log Roll   Lying on back, bend left knee and place left  arm across chest. Roll all in one movement to the right. Reverse to roll to the left. Always move as one unit. Housework - Sweeping  Use long-handled equipment to avoid stooping.   Housework - Wiping  Position yourself as close as possible to reach work surface. Avoid straining your back.  Laundry - Unloading Wash   To unload small items at bottom of washer, lift leg opposite to arm being used to reach.  Gardening - Raking  Move close to area to be raked. Use arm movements to do the work. Keep back straight and avoid twisting.      Cart  When reaching into cart with one arm, lift opposite leg to keep back straight.   Getting Into / Out of Bed  Lower self to lie down on one side by raising legs and lowering head at the same time. Use arms to assist moving without twisting. Bend both knees to roll onto back if desired. To sit up, start from lying on side, and use same move-ments in reverse. Housework - Vacuuming  Hold the vacuum with arm held at side. Step back and forth to move it, keeping head up. Avoid twisting.   Laundry - Armed forces training and education officer so that bending and twisting can be avoided.   Laundry - Unloading Dryer  Squat down to reach into clothes dryer or use a reacher.  Gardening - Weeding / Psychiatric nurse or Kneel. Knee pads may be helpful.                  Posture Tips DO: - stand tall and erect - keep chin tucked in - keep head and shoulders in alignment - check posture regularly in mirror or large window - pull head back against headrest in car seat;  Change your position often.  Sit with lumbar support. DON'T: - slouch or slump while watching TV or reading - sit, stand or lie in one position  for too long;  Sitting is especially hard on the spine so if you sit at a desk/use the computer, then stand up often!   Copyright  VHI. All rights reserved.  Posture - Standing   Good posture is important. Avoid slouching and forward head thrust. Maintain curve in low back and align ears over shoul- ders, hips over ankles.  Pull your belly button in toward your back bone.   Copyright  VHI. All rights reserved.  Posture - Sitting   Sit upright, head facing forward. Try using a roll to support lower back. Keep shoulders relaxed, and avoid rounded back. Keep hips level with knees. Avoid crossing legs for long periods.   Copyright  VHI. All rights reserved.  Garen Lah, PT, ATRIC Certified Exercise Expert for the Aging Adult  08/14/23 3:57 PM Phone:  [phone] Fax: [phone]

## 2023-08-15 NOTE — Therapy (Signed)
 OUTPATIENT PHYSICAL THERAPY THORACOLUMBAR TREATMENT   Patient Name: Alice Frye MRN: [id number] DOB:[birth year], 33 y.o., female Today's Date: 08/16/2023  END OF SESSION:  PT End of Session - 08/16/23 1434     Visit Number 5    Number of Visits 12    Date for PT Re-Evaluation 09/15/23    Authorization Type [place] [employer]    PT Start Time 1420    PT Stop Time 1505    PT Time Calculation (min) 45 min    Activity Tolerance Patient tolerated treatment well    Behavior During Therapy [facility] for tasks assessed/performed                 History reviewed. No pertinent past medical history. Past Surgical History:  Procedure Laterality Date   CESAREAN SECTION     Patient Active Problem List   Diagnosis Date Noted   VBAC (vaginal birth after Cesarean) 05/25/2019   Nexplanon in place 05/25/2019   PROM (premature rupture of membranes) 05/23/2019   History of cesarean section 05/23/2019   History of VBAC 05/23/2019   Labor and delivery indication for care or intervention 06/21/2014   Ruptured, membranes, premature 06/21/2014   Injury of ankle, left 04/24/2013   Insertion of Nexplanon 08/29/2012   Vaginosis 08/15/2012   General counseling and advice on female contraception 01/04/2012   OBESITY 05/13/2008    PCP: Department, [facility]   REFERRING PROVIDER: Harlen Labs, NP   REFERRING DIAG: thoracic back pain,  (M54.6 Back pain), hypertrophy of breast  THERAPY DIAG:  Bilateral low back pain with right-sided sciatica, unspecified chronicity  Muscle weakness (generalized)  Abnormal posture  Rationale for Evaluation and Treatment: Rehabilitation  ONSET DATE: Pt reports back pain since middle school but worse in the last 6 months  SUBJECTIVE:                                                                                                                                                                                                          SUBJECTIVE STATEMENT:  No pain today. I am doing my exercises at home   EVAL - Pt reports back pain since middle school and I just never did anything about it.  My pain has gotten worse in the last 6 months. I am not able to exercise because of pain with my breasts and how affects my back.  I would like to get healthier and walking. I am trying to walk 30-45 minutes. I tried to do do weights at home but I ended up  in the hospital. I did not know what to do.  I can't sit for more than 30 minutes.   Hand dominance: Right  PERTINENT HISTORY:  L ankle fx > 5 years, obesity,   PAIN:  Are you having pain? Yes: NPRS scale: without meds  10/10 and at rest now 0/10 sitting but doing chores for more than 30 minutes 10/10  Pain location: low back Pain description: sharp pain in low back and sometimes goes down to my Right buttocks Aggravating factors: sitting for more than 30 minutes. Have to sleep on my sides and constantly at night for only 3-4 hours, laundry,and bending over,  cleaning the tub going up and down steps Relieving factors: pain meds  PRECAUTIONS: None  RED FLAGS: None     WEIGHT BEARING RESTRICTIONS: No  FALLS:  Has patient fallen in last 6 months? Yes. Number of falls 1  going down steps and tripped and fell last week  LIVING ENVIRONMENT: Lives with: lives with their family Lives in: House/apartment Stairs:  no stairs in home but has pain on stairs Has following equipment at home: None  OCCUPATION: Geologist, engineering  PLOF: Independent  PATIENT GOALS: Goals to learn exercises and help my pain and be able to exercise  NEXT MD VISIT: TBD  OBJECTIVE:  Note: Objective measures were completed at Evaluation unless otherwise noted.  DIAGNOSTIC FINDINGS:  None recent  PATIENT SURVEYS:  Modified Oswestry 42%  08-16-23  4%  COGNITION: Overall cognitive status: Within functional limits for tasks assessed  SENSATION: WFL  POSTURE: rounded shoulders, forward head,  anterior pelvic tilt, and obesity  PALPATION: TTP over iliac crest and gluteals on right.  Bil low back tissue tension and TTP  R> L    LUMBAR SPECIAL TESTS:  Straight leg raise test: Negative and Slump test: Negative     LUMBAR ROM:   AROM eval 08-14-23 08-16-23  Flexion Fingertips to midshin  fingertips toes  Extension 50% with pain 75% 75%  Right lateral flexion WNL WNL WNL  Left lateral flexion WNL WNL WNL  Right rotation WNL WNL WNL  Left rotation WNL WNL WNL   (Blank rows = not tested)  LOWER EXTREMITY ROM:     Active  Right eval Left eval  Hip flexion 90 supine 90 supine  Hip extension    Hip abduction    Hip adduction    Hip internal rotation 19 22  Hip external rotation    Knee flexion    Knee extension    Ankle dorsiflexion    Ankle plantarflexion    Ankle inversion    Ankle eversion     (Blank rows = not tested)  LOWER EXTREMITY MMT:    MMT Right eval Left eval  Hip flexion 4+ 4+  Hip extension 4 4  Hip abduction 4 4  Hip adduction    Hip internal rotation    Hip external rotation    Knee flexion 5 5  Knee extension 5 5  Ankle dorsiflexion    Ankle plantarflexion    Ankle inversion    Ankle eversion     (Blank rows = not tested)   (Blank rows = not tested) (Key: WFL = within functional limits not formally assessed, * = concordant pain, s = stiffness/stretching sensation, NT = not tested)  Comments:     FUNCTIONAL TESTS:  5 times sit to stand: 13.23 sec  08-16-23  8.45 sec   [facility] Adult PT Treatment:  DATE: 08-16-23 goals Therapeutic Exercise: Nustep 5 min level 3 UE/LE Horizontal abduction omega  25 # 15 x, 25 # 12x, 35 # 10 x Lat pull omega  25 # 15 x, 25 # 12x, 35 # 10 x Seated bench press Omega  5x with 20 #, then 5 x with 25#, then 8 x On mat bent over row with 15 # KB 15 x on R and L Therapeutic Activity: STS 2 x 15  15 # KB Deadlift of 15 # Self Care:  Community wellness  opportunities  [facility] Adult PT Treatment:                                                DATE: 08-14-23 Therapeutic Exercise: Elliptical level 2 6 min UE/LE Prone I, Y T   10 x each with 2lb DB ( 3 rounds) Bent over row with 15 # KB on mat with opposite hand and knee Pull downs with UE BTB in door way  2 x 10    Self Care: Posture Body mechanics  Lifting techniques and demo  [facility] Adult PT Treatment:                                                DATE: 08-08-23 Therapeutic Exercise: UBE  2.5 level    2.5 min forward and backward while taking subjective Standing scaption bil with 5lb DB 2 x 10 Standing scaption bil with 7 lb DB  4 x only with fatigue and start of upper thoracic spasm Standing bil shld abduction 3 x 10 5 lb DB Sidelying clamshell  2 x 10 on R and L Bridge with ball  2 x 10 Bear plank  10 sec hold x 5  Therapeutic Activity: Step ups on 6 inch step with 5 lb DB each hand  3 x 10 Lateral step up with 10 lb weight on R 3 x 10 Lateral step up with 10 lb weight on L 3 x 10 STS with 15 # KB 3 x 10  Self Care: Sleep Hygiene  [facility] Adult PT Treatment:                                                DATE: 08-07-23 Therapeutic Exercise: Prone I, Y and T with 1 lb DB  3 x 10 BTB bil shld extension  3 x 10 ER shoulder with BTB 3 x 10  PPT LTR SKTC Bridge with ball 3 x 10 Supine march Sidelying resisted clam Squat to chair with 10 # DB  3 x 10  TREATMENT DATE: 08-01-23  Eval and issue HEP  PATIENT EDUCATION:  Education details: POC Explanation of findings, issue HEP, posture initial education Person educated: Patient Education method: Explanation, Demonstration, Tactile cues, Verbal cues, and Handouts Education comprehension: verbalized understanding, returned demonstration, verbal cues required, tactile cues required, and needs further  education  HOME EXERCISE PROGRAM: Access Code: [email or web address] URL: [email or web address] Date: 08/01/2023 Prepared by: Garen Lah  Exercises - Supine Pelvic Tilt  - 1 x daily - 7 x weekly - 3 sets - 10 reps - Supine Single Knee to Chest Stretch  - 2 x daily - 7 x weekly - 1 sets - 5 reps - 10 hold - Supine Lower Trunk Rotation  - 2 x daily - 7 x weekly - 1 sets - 5 reps - 20 hold - Supine Hamstring Stretch with Strap  - 2 x daily - 7 x weekly - 1 sets - 3 reps - 20-41msec hold - Supine Bridge with Mini Swiss Ball Between Knees  - 1 x daily - 7 x weekly - 3 sets - 10 reps Added 08-07-23  - Supine March  - 1 x daily - 7 x weekly - 3 sets - 10 reps - Squat with Chair Touch  - 1 x daily - 7 x weekly - 3 sets - 10 reps - Prone Scapular Retraction Y  - 1 x daily - 7 x weekly - 3 sets - 10 reps - Prone Shoulder Horizontal Abduction with Thumbs Up  - 1 x daily - 7 x weekly - 3 sets - 10 reps - Prone Shoulder Extension  - 1 x daily - 7 x weekly - 3 sets - 10 reps  Added 08-09-23 - Bear Plank from Quadruped  - 1 x daily - 7 x weekly - 1 sets - 5 reps - 10-30 sec hold ASSESSMENT:  CLINICAL IMPRESSION: Pt returns to clinic with no pain and improved ODI to 4% and achieved LTG # 2 and #6.  Pt is doing well and has not pain but does have some sharp  pain after standing for 1 hour on left low back but not at same intensity as on eval. Pt was educated on community wellness opportunities to attend after DC from PT.  Session also concentrated on use of gym equipment and proper form. Use of increasing weights for resistance used.   Will continue for goal completion until DC   EVAL- Patient is a 33 y.o. female who was seen today for physical therapy evaluation and treatment for breast hypertrophy and low back pain and thoracic pain.  Pt has had pain since middle school but pain has increased to 10/10 in last 6 months.  Pt complains of difficulty sleeping , sitting and standing and walking for  longer than 30 minutes causes 10/10 pain in low back and radiating into R buttock. Pt will benefit from skilled PT to address impairments.  OBJECTIVE IMPAIRMENTS: decreased activity tolerance, decreased mobility, difficulty walking, decreased ROM, decreased strength, postural dysfunction, obesity, and pain.   ACTIVITY LIMITATIONS: lifting, bending, sitting, standing, squatting, sleeping, stairs, and locomotion level  PARTICIPATION LIMITATIONS: meal prep, cleaning, laundry, and driving  PERSONAL FACTORS: L ankle fx > 5 years, obesity,  are also affecting patient's functional outcome.   REHAB POTENTIAL: Good  CLINICAL DECISION MAKING: Evolving/moderate complexity  EVALUATION COMPLEXITY: Moderate   GOALS: Goals reviewed with patient? Yes  SHORT TERM GOALS: Target date: 08-25-23 corrected  Pt will be independent with initial HEP Baseline:  Goal status: MET  2.  Pt will demonstrate extension lumbar AROM  in order to demonstrate improved tolerance to functional movement patterns.  Baseline: limited 50% Goal status: MET  3.  Demonstrate understanding of neutral posture and be more conscious of position and posture throughout the day.  Baseline:  Goal status: MET   LONG TERM GOALS: Target date: 09-15-23  Pt will be able to perform advanced HEP and be able to demonstrate proper lifting Baseline: no knowledge Goal status: ONGOING  2.  Pt will be able to report sleeping more than 4 hours of uninterrupted sleep for more restorative rest Baseline: can only sleep on sides for 3 hours max 08-16-23  At least sleeping 6 hours a night and doing well Goal status: MET  3.  Pt will be able to stand for 1 hour with minimal pain in order to perform job/household chore duties Baseline: Pain 10/10 08-16-23  Larose is able to stand for 1 hour but then has pain and then sharp pain Goal status: ONGOING  4.  .  Pt will demonstrate back/LE MMT of dead lift  25# in order to demonstrate improved strength  for functional movements.  Baseline: See MMT chart 08-16-23  deadlifting 15 # with no issue Goal status:ONGOING  5.  Pt will tolerate sitting 1 hour in order to watch a show minimal to  pain Baseline: can only sit 30 min with 10/10 08-16-23  able to watch movie for 2 hours Goal status:ONGOING  6.  will show a >/= 12 pt improvement in their ODI score (MCID is 12 pts) as a proxy for functional improvement Baseline: 42 08-16-23  4% Goal status: MET   PLAN:  PT FREQUENCY: 1-2x/week  PT DURATION: 6 weeks  PLANNED INTERVENTIONS: 97164- PT Re-evaluation, 97110-Therapeutic exercises, 97530- Therapeutic activity, 97112- Neuromuscular re-education, 97535- Self Care, 96045- Manual therapy, 97116- Gait training, [phone]- Electrical stimulation (manual), Patient/Family education, Stair training, Taping, Dry Needling, Joint mobilization, Spinal mobilization, Cryotherapy, and Moist heat  PLAN FOR NEXT SESSION: TPDN,  HEP, Manual, posture education, sleep    Garen Lah, PT, [facility] Certified Exercise Expert for the Aging Adult  08/16/23 3:15 PM Phone: [phone] Fax: [phone]   For all possible CPT codes, reference the Planned Interventions line above.     Check all conditions that are expected to impact treatment: {Conditions expected to impact treatment:Morbid obesity   If treatment provided at initial evaluation, no treatment charged due to lack of authorization.

## 2023-08-16 ENCOUNTER — Ambulatory Visit: Payer: Medicaid Other | Admitting: Physical Therapy

## 2023-08-16 ENCOUNTER — Encounter: Payer: Self-pay | Admitting: Physical Therapy

## 2023-08-16 DIAGNOSIS — M5441 Lumbago with sciatica, right side: Secondary | ICD-10-CM

## 2023-08-16 DIAGNOSIS — R293 Abnormal posture: Secondary | ICD-10-CM

## 2023-08-16 DIAGNOSIS — M6281 Muscle weakness (generalized): Secondary | ICD-10-CM

## 2023-08-21 ENCOUNTER — Ambulatory Visit: Payer: Medicaid Other | Admitting: Physical Therapy

## 2023-08-21 DIAGNOSIS — M5441 Lumbago with sciatica, right side: Secondary | ICD-10-CM

## 2023-08-21 DIAGNOSIS — M6281 Muscle weakness (generalized): Secondary | ICD-10-CM

## 2023-08-21 DIAGNOSIS — R293 Abnormal posture: Secondary | ICD-10-CM

## 2023-08-21 NOTE — Therapy (Signed)
 OUTPATIENT PHYSICAL THERAPY THORACOLUMBAR TREATMENT   Patient Name: Alice Frye MRN: [id number] DOB:1990/10/03, 33 y.o., female Today's Date: 08/21/2023  END OF SESSION:  PT End of Session - 08/21/23 1551     Visit Number 6    Number of Visits 12    Date for PT Re-Evaluation 09/15/23    Authorization Type [place] [employer]    PT Start Time 1546    PT Stop Time 1630    PT Time Calculation (min) 44 min    Activity Tolerance Patient tolerated treatment well    Behavior During Therapy [facility] for tasks assessed/performed                  No past medical history on file. Past Surgical History:  Procedure Laterality Date   CESAREAN SECTION     Patient Active Problem List   Diagnosis Date Noted   VBAC (vaginal birth after Cesarean) 05/25/2019   Nexplanon in place 05/25/2019   PROM (premature rupture of membranes) 05/23/2019   History of cesarean section 05/23/2019   History of VBAC 05/23/2019   Labor and delivery indication for care or intervention 06/21/2014   Ruptured, membranes, premature 06/21/2014   Injury of ankle, left 04/24/2013   Insertion of Nexplanon 08/29/2012   Vaginosis 08/15/2012   General counseling and advice on female contraception 01/04/2012   OBESITY 05/13/2008    PCP: Department, [facility]   REFERRING PROVIDER: Harlen Labs, NP   REFERRING DIAG: thoracic back pain,  (M54.6 Back pain), hypertrophy of breast  THERAPY DIAG:  Bilateral low back pain with right-sided sciatica, unspecified chronicity  Muscle weakness (generalized)  Abnormal posture  Rationale for Evaluation and Treatment: Rehabilitation  ONSET DATE: Pt reports back pain since middle school but worse in the last 6 months  SUBJECTIVE:                                                                                                                                                                                                         SUBJECTIVE  STATEMENT:  No pain today. I am doing my exercises at home   EVAL - Pt reports back pain since middle school and I just never did anything about it.  My pain has gotten worse in the last 6 months. I am not able to exercise because of pain with my breasts and how affects my back.  I would like to get healthier and walking. I am trying to walk 30-45 minutes. I tried to do do weights at home but I ended up  in the hospital. I did not know what to do.  I can't sit for more than 30 minutes.   Hand dominance: Right  PERTINENT HISTORY:  L ankle fx > 5 years, obesity,   PAIN:  Are you having pain? Yes: NPRS scale: without meds  10/10 and at rest now 0/10 sitting but doing chores for more than 30 minutes 10/10  Pain location: low back Pain description: sharp pain in low back and sometimes goes down to my Right buttocks Aggravating factors: sitting for more than 30 minutes. Have to sleep on my sides and constantly at night for only 3-4 hours, laundry,and bending over,  cleaning the tub going up and down steps Relieving factors: pain meds  PRECAUTIONS: None  RED FLAGS: None     WEIGHT BEARING RESTRICTIONS: No  FALLS:  Has patient fallen in last 6 months? Yes. Number of falls 1  going down steps and tripped and fell last week  LIVING ENVIRONMENT: Lives with: lives with their family Lives in: House/apartment Stairs:  no stairs in home but has pain on stairs Has following equipment at home: None  OCCUPATION: Geologist, engineering  PLOF: Independent  PATIENT GOALS: Goals to learn exercises and help my pain and be able to exercise  NEXT MD VISIT: TBD  OBJECTIVE:  Note: Objective measures were completed at Evaluation unless otherwise noted.  DIAGNOSTIC FINDINGS:  None recent  PATIENT SURVEYS:  Modified Oswestry 42%  08-16-23  4%  COGNITION: Overall cognitive status: Within functional limits for tasks assessed  SENSATION: WFL  POSTURE: rounded shoulders, forward head, anterior pelvic  tilt, and obesity  PALPATION: TTP over iliac crest and gluteals on right.  Bil low back tissue tension and TTP  R> L    LUMBAR SPECIAL TESTS:  Straight leg raise test: Negative and Slump test: Negative     LUMBAR ROM:   AROM eval 08-14-23 08-16-23  Flexion Fingertips to midshin  fingertips toes  Extension 50% with pain 75% 75%  Right lateral flexion WNL WNL WNL  Left lateral flexion WNL WNL WNL  Right rotation WNL WNL WNL  Left rotation WNL WNL WNL   (Blank rows = not tested)  LOWER EXTREMITY ROM:     Active  Right eval Left eval  Hip flexion 90 supine 90 supine  Hip extension    Hip abduction    Hip adduction    Hip internal rotation 19 22  Hip external rotation    Knee flexion    Knee extension    Ankle dorsiflexion    Ankle plantarflexion    Ankle inversion    Ankle eversion     (Blank rows = not tested)  LOWER EXTREMITY MMT:    MMT Right eval Left eval  Hip flexion 4+ 4+  Hip extension 4 4  Hip abduction 4 4  Hip adduction    Hip internal rotation    Hip external rotation    Knee flexion 5 5  Knee extension 5 5  Ankle dorsiflexion    Ankle plantarflexion    Ankle inversion    Ankle eversion     (Blank rows = not tested)   (Blank rows = not tested) (Key: WFL = within functional limits not formally assessed, * = concordant pain, s = stiffness/stretching sensation, NT = not tested)  Comments:     FUNCTIONAL TESTS:  5 times sit to stand: 13.23 sec  08-16-23  8.45 sec  [facility] Adult PT Treatment:  DATE: 08-21-23 Therapeutic Exercise: Nustep 5 min level 3 UE/LE 15 # KB OH press on R 9x, L 10 BTB star pattern 1 x 10 15 # KB OH press on R 5 to fatigue, L BTB star pattern 2 x 10 Prone IYT with 4lb DB bil 3 x 10 Bear plank 15 sec, 9 sec , 10sec Bridge with ball  3 x 10 SL bridge on R 5 x and L 5 x with spasm Abdominal 90 90 hold for 10 sec x 6 Self Care:  Core progression and progressive  overloading   [facility] Adult PT Treatment:                                                DATE: 08-16-23 goals Therapeutic Exercise: Nustep 5 min level 3 UE/LE Horizontal abduction omega  25 # 15 x, 25 # 12x, 35 # 10 x Lat pull omega  25 # 15 x, 25 # 12x, 35 # 10 x Seated bench press Omega  5x with 20 #, then 5 x with 25#, then 8 x On mat bent over row with 15 # KB 15 x on R and L Therapeutic Activity: STS 2 x 15  15 # KB Deadlift of 15 # Self Care:  Community wellness opportunities  [facility] Adult PT Treatment:                                                DATE: 08-14-23 Therapeutic Exercise: Elliptical level 2 6 min UE/LE Prone I, Y T   10 x each with 2lb DB ( 3 rounds) Bent over row with 15 # KB on mat with opposite hand and knee Pull downs with UE BTB in door way  2 x 10    Self Care: Posture Body mechanics  Lifting techniques and demo  [facility] Adult PT Treatment:                                                DATE: 08-08-23 Therapeutic Exercise: UBE  2.5 level    2.5 min forward and backward while taking subjective Standing scaption bil with 5lb DB 2 x 10 Standing scaption bil with 7 lb DB  4 x only with fatigue and start of upper thoracic spasm Standing bil shld abduction 3 x 10 5 lb DB Sidelying clamshell  2 x 10 on R and L Bridge with ball  2 x 10 Bear plank  10 sec hold x 5  Therapeutic Activity: Step ups on 6 inch step with 5 lb DB each hand  3 x 10 Lateral step up with 10 lb weight on R 3 x 10 Lateral step up with 10 lb weight on L 3 x 10 STS with 15 # KB 3 x 10  Self Care: Sleep Hygiene  [facility] Adult PT Treatment:  DATE: 08-07-23 Therapeutic Exercise: Prone I, Y and T with 1 lb DB  3 x 10 BTB bil shld extension  3 x 10 ER shoulder with BTB 3 x 10  PPT LTR SKTC Bridge with ball 3 x 10 Supine march Sidelying resisted clam Squat to chair with 10 # DB  3 x 10  TREATMENT DATE: 08-01-23  Eval and issue HEP                                                                                                                                  PATIENT EDUCATION:  Education details: POC Explanation of findings, issue HEP, posture initial education Person educated: Patient Education method: Explanation, Demonstration, Tactile cues, Verbal cues, and Handouts Education comprehension: verbalized understanding, returned demonstration, verbal cues required, tactile cues required, and needs further education  HOME EXERCISE PROGRAM: Access Code: [email or web address] URL: [email or web address] Date: 08/01/2023 Prepared by: Garen Lah  Exercises - Supine Pelvic Tilt  - 1 x daily - 7 x weekly - 3 sets - 10 reps - Supine Single Knee to Chest Stretch  - 2 x daily - 7 x weekly - 1 sets - 5 reps - 10 hold - Supine Lower Trunk Rotation  - 2 x daily - 7 x weekly - 1 sets - 5 reps - 20 hold - Supine Hamstring Stretch with Strap  - 2 x daily - 7 x weekly - 1 sets - 3 reps - 20-41msec hold - Supine Bridge with Mini Swiss Ball Between Knees  - 1 x daily - 7 x weekly - 3 sets - 10 reps Added 08-07-23  - Supine March  - 1 x daily - 7 x weekly - 3 sets - 10 reps - Squat with Chair Touch  - 1 x daily - 7 x weekly - 3 sets - 10 reps - Prone Scapular Retraction Y  - 1 x daily - 7 x weekly - 3 sets - 10 reps - Prone Shoulder Horizontal Abduction with Thumbs Up  - 1 x daily - 7 x weekly - 3 sets - 10 reps - Prone Shoulder Extension  - 1 x daily - 7 x weekly - 3 sets - 10 reps  Added 08-09-23 - Bear Plank from Quadruped  - 1 x daily - 7 x weekly - 1 sets - 5 reps - 10-30 sec hold Added 08-21-23 - Supine 90/90 Abdominal Bracing  - 1 x daily - 7 x weekly - 1 sets - 5 reps - 30 sec hold  ASSESSMENT:  CLINICAL IMPRESSION: Pt returns to clinic with no pain and improving subscapular strength.  Pt is also working on core strength and shows weakness in abdominal strength only able to hold plank and tabletop positions about 10 seconds but  improving form 5 sec hold. Pt is using remaining visits to maximize strength and to learn proper lifting and execution of exercises for HEP.  Pt did experience hamstring spasms  with bridging today.   Educated on self care and core progression and progressive overload in order to increase muscle strength requiring challenge.  Will continue for goal completion until DC   EVAL- Patient is a 33 y.o. female who was seen today for physical therapy evaluation and treatment for breast hypertrophy and low back pain and thoracic pain.  Pt has had pain since middle school but pain has increased to 10/10 in last 6 months.  Pt complains of difficulty sleeping , sitting and standing and walking for longer than 30 minutes causes 10/10 pain in low back and radiating into R buttock. Pt will benefit from skilled PT to address impairments.  OBJECTIVE IMPAIRMENTS: decreased activity tolerance, decreased mobility, difficulty walking, decreased ROM, decreased strength, postural dysfunction, obesity, and pain.   ACTIVITY LIMITATIONS: lifting, bending, sitting, standing, squatting, sleeping, stairs, and locomotion level  PARTICIPATION LIMITATIONS: meal prep, cleaning, laundry, and driving  PERSONAL FACTORS: L ankle fx > 5 years, obesity,  are also affecting patient's functional outcome.   REHAB POTENTIAL: Good  CLINICAL DECISION MAKING: Evolving/moderate complexity  EVALUATION COMPLEXITY: Moderate   GOALS: Goals reviewed with patient? Yes  SHORT TERM GOALS: Target date: 08-25-23 corrected  Pt will be independent with initial HEP Baseline:  Goal status: MET  2.  Pt will demonstrate extension lumbar AROM in order to demonstrate improved tolerance to functional movement patterns.  Baseline: limited 50% Goal status: MET  3.  Demonstrate understanding of neutral posture and be more conscious of position and posture throughout the day.  Baseline:  Goal status: MET   LONG TERM GOALS: Target date: 09-15-23  Pt  will be able to perform advanced HEP and be able to demonstrate proper lifting Baseline: no knowledge Goal status: ONGOING  2.  Pt will be able to report sleeping more than 4 hours of uninterrupted sleep for more restorative rest Baseline: can only sleep on sides for 3 hours max 08-16-23  At least sleeping 6 hours a night and doing well Goal status: MET  3.  Pt will be able to stand for 1 hour with minimal pain in order to perform job/household chore duties Baseline: Pain 10/10 08-16-23  Corrine is able to stand for 1 hour but then has pain and then sharp pain Goal status: ONGOING  4.  .  Pt will demonstrate back/LE MMT of dead lift  25# in order to demonstrate improved strength for functional movements.  Baseline: See MMT chart 08-16-23  deadlifting 15 # with no issue Goal status:ONGOING  5.  Pt will tolerate sitting 1 hour in order to watch a show minimal to  pain Baseline: can only sit 30 min with 10/10 08-16-23  able to watch movie for 2 hours Goal status:ONGOING  6.  will show a >/= 12 pt improvement in their ODI score (MCID is 12 pts) as a proxy for functional improvement Baseline: 42 08-16-23  4% Goal status: MET   PLAN:  PT FREQUENCY: 1-2x/week  PT DURATION: 6 weeks  PLANNED INTERVENTIONS: 97164- PT Re-evaluation, 97110-Therapeutic exercises, 97530- Therapeutic activity, 97112- Neuromuscular re-education, 97535- Self Care, 16109- Manual therapy, 97116- Gait training, [phone]- Electrical stimulation (manual), Patient/Family education, Stair training, Taping, Dry Needling, Joint mobilization, Spinal mobilization, Cryotherapy, and Moist heat  PLAN FOR NEXT SESSION: TPDN,  HEP, Manual, posture education, sleep    Garen Lah, PT, [facility] Certified Exercise Expert for the Aging Adult  08/21/23 4:36 PM Phone: [phone] Fax: [phone]   For all possible CPT codes, reference the Planned Interventions  line above.     Check all conditions that are expected to impact  treatment: {Conditions expected to impact treatment:Morbid obesity   If treatment provided at initial evaluation, no treatment charged due to lack of authorization.

## 2023-08-22 NOTE — Therapy (Signed)
 OUTPATIENT PHYSICAL THERAPY THORACOLUMBAR TREATMENT   Patient Name: Alice Frye MRN: [id number] DOB:01-01-1991, 33 y.o., female Today's Date: 08/23/2023  END OF SESSION:  PT End of Session - 08/23/23 1435     Visit Number 6    Number of Visits 12    Date for PT Re-Evaluation 09/15/23    Authorization Type [place] [employer]    PT Start Time 1420    PT Stop Time 1505    PT Time Calculation (min) 45 min    Activity Tolerance Patient tolerated treatment well    Behavior During Therapy [facility] for tasks assessed/performed                   History reviewed. No pertinent past medical history. Past Surgical History:  Procedure Laterality Date   CESAREAN SECTION     Patient Active Problem List   Diagnosis Date Noted   VBAC (vaginal birth after Cesarean) 05/25/2019   Nexplanon in place 05/25/2019   PROM (premature rupture of membranes) 05/23/2019   History of cesarean section 05/23/2019   History of VBAC 05/23/2019   Labor and delivery indication for care or intervention 06/21/2014   Ruptured, membranes, premature 06/21/2014   Injury of ankle, left 04/24/2013   Insertion of Nexplanon 08/29/2012   Vaginosis 08/15/2012   General counseling and advice on female contraception 01/04/2012   OBESITY 05/13/2008    PCP: Department, [facility]   REFERRING PROVIDER: Harlen Labs, NP   REFERRING DIAG: thoracic back pain,  (M54.6 Back pain), hypertrophy of breast  THERAPY DIAG:  Bilateral low back pain with right-sided sciatica, unspecified chronicity  Muscle weakness (generalized)  Abnormal posture  Rationale for Evaluation and Treatment: Rehabilitation  ONSET DATE: Pt reports back pain since middle school but worse in the last 6 months  SUBJECTIVE:                                                                                                                                                                                                          SUBJECTIVE STATEMENT:  No pain today. Ready to work on [employer] and strength today. I am able to stand for an hour to do chores and at work with no problem.   EVAL - Pt reports back pain since middle school and I just never did anything about it.  My pain has gotten worse in the last 6 months. I am not able to exercise because of pain with my breasts and how affects my back.  I would like to get healthier  and walking. I am trying to walk 30-45 minutes. I tried to do do weights at home but I ended up in the hospital. I did not know what to do.  I can't sit for more than 30 minutes.   Hand dominance: Right  PERTINENT HISTORY:  L ankle fx > 5 years, obesity,   PAIN:  Are you having pain? Yes: NPRS scale: without meds  10/10 and at rest now 0/10 sitting but doing chores for more than 30 minutes 10/10  Pain location: low back Pain description: sharp pain in low back and sometimes goes down to my Right buttocks Aggravating factors: sitting for more than 30 minutes. Have to sleep on my sides and constantly at night for only 3-4 hours, laundry,and bending over,  cleaning the tub going up and down steps Relieving factors: pain meds  PRECAUTIONS: None  RED FLAGS: None     WEIGHT BEARING RESTRICTIONS: No  FALLS:  Has patient fallen in last 6 months? Yes. Number of falls 1  going down steps and tripped and fell last week  LIVING ENVIRONMENT: Lives with: lives with their family Lives in: House/apartment Stairs:  no stairs in home but has pain on stairs Has following equipment at home: None  OCCUPATION: Geologist, engineering  PLOF: Independent  PATIENT GOALS: Goals to learn exercises and help my pain and be able to exercise  NEXT MD VISIT: TBD  OBJECTIVE:  Note: Objective measures were completed at Evaluation unless otherwise noted.  DIAGNOSTIC FINDINGS:  None recent  PATIENT SURVEYS:  Modified Oswestry 42%  08-16-23  4%  COGNITION: Overall cognitive status: Within functional  limits for tasks assessed  SENSATION: WFL  POSTURE: rounded shoulders, forward head, anterior pelvic tilt, and obesity  PALPATION: TTP over iliac crest and gluteals on right.  Bil low back tissue tension and TTP  R> L    LUMBAR SPECIAL TESTS:  Straight leg raise test: Negative and Slump test: Negative     LUMBAR ROM:   AROM eval 08-14-23 08-16-23  Flexion Fingertips to midshin  fingertips toes  Extension 50% with pain 75% 75%  Right lateral flexion WNL WNL WNL  Left lateral flexion WNL WNL WNL  Right rotation WNL WNL WNL  Left rotation WNL WNL WNL   (Blank rows = not tested)  LOWER EXTREMITY ROM:     Active  Right eval Left eval  Hip flexion 90 supine 90 supine  Hip extension    Hip abduction    Hip adduction    Hip internal rotation 19 22  Hip external rotation    Knee flexion    Knee extension    Ankle dorsiflexion    Ankle plantarflexion    Ankle inversion    Ankle eversion     (Blank rows = not tested)  LOWER EXTREMITY MMT:    MMT Right eval Left eval R/L 08-23-23  Hip flexion 4+ 4+ 4/5  Hip extension 4 4 4+/4+  Hip abduction 4 4 4+/4+  Hip adduction     Hip internal rotation     Hip external rotation     Knee flexion 5 5 5/5  Knee extension 5 5 5/5  Ankle dorsiflexion     Ankle plantarflexion     Ankle inversion     Ankle eversion      (Blank rows = not tested)   (Blank rows = not tested) (Key: WFL = within functional limits not formally assessed, * = concordant pain, s = stiffness/stretching sensation, NT =  not tested)  Comments:     FUNCTIONAL TESTS:  5 times sit to stand: 13.23 sec  08-16-23  8.45 sec   [facility] Adult PT Treatment:                                                DATE: 08-23-23 Therapeutic Exercise: Treadmill  12 min and covering .50 miles at 2.5 and progressing ot 3.0 mph Gorilla row  2 x 515 # KB Bent over row  2 x 10 with 15 # KB [place] deadlift it with 25 # training bar   2 x 10 Clean and snatch with 25 # training  bar 1 x 10 Clean and press with 25 # training bar 2 x 6 Counter push ups 4 x 3 push ups with cuing for execution  [facility] Adult PT Treatment:                                                DATE: 08-21-23 Therapeutic Exercise: Nustep 5 min level 3 UE/LE 15 # KB OH press on R 9x, L 10 BTB star pattern 1 x 10 15 # KB OH press on R 5 to fatigue, L BTB star pattern 2 x 10 Prone IYT with 4lb DB bil 3 x 10 Bear plank 15 sec, 9 sec , 10sec Bridge with ball  3 x 10 SL bridge on R 5 x and L 5 x with spasm Abdominal 90 90 hold for 10 sec x 6 Self Care:  Core progression and progressive overloading   [facility] Adult PT Treatment:                                                DATE: 08-16-23 goals Therapeutic Exercise: Nustep 5 min level 3 UE/LE Horizontal abduction omega  25 # 15 x, 25 # 12x, 35 # 10 x Lat pull omega  25 # 15 x, 25 # 12x, 35 # 10 x Seated bench press Omega  5x with 20 #, then 5 x with 25#, then 8 x On mat bent over row with 15 # KB 15 x on R and L Therapeutic Activity: STS 2 x 15  15 # KB Deadlift of 15 # Self Care:  Community wellness opportunities  [facility] Adult PT Treatment:                                                DATE: 08-14-23 Therapeutic Exercise: Elliptical level 2 6 min UE/LE Prone I, Y T   10 x each with 2lb DB ( 3 rounds) Bent over row with 15 # KB on mat with opposite hand and knee Pull downs with UE BTB in door way  2 x 10    Self Care: Posture Body mechanics  Lifting techniques and demo  [facility] Adult PT Treatment:  DATE: 08-08-23 Therapeutic Exercise: UBE  2.5 level    2.5 min forward and backward while taking subjective Standing scaption bil with 5lb DB 2 x 10 Standing scaption bil with 7 lb DB  4 x only with fatigue and start of upper thoracic spasm Standing bil shld abduction 3 x 10 5 lb DB Sidelying clamshell  2 x 10 on R and L Bridge with ball  2 x 10 Bear plank  10 sec hold x 5  Therapeutic  Activity: Step ups on 6 inch step with 5 lb DB each hand  3 x 10 Lateral step up with 10 lb weight on R 3 x 10 Lateral step up with 10 lb weight on L 3 x 10 STS with 15 # KB 3 x 10  Self Care: Sleep Hygiene  [facility] Adult PT Treatment:                                                DATE: 08-07-23 Therapeutic Exercise: Prone I, Y and T with 1 lb DB  3 x 10 BTB bil shld extension  3 x 10 ER shoulder with BTB 3 x 10  PPT LTR SKTC Bridge with ball 3 x 10 Supine march Sidelying resisted clam Squat to chair with 10 # DB  3 x 10  TREATMENT DATE: 08-01-23  Eval and issue HEP                                                                                                                                 PATIENT EDUCATION:  Education details: POC Explanation of findings, issue HEP, posture initial education Person educated: Patient Education method: Explanation, Demonstration, Tactile cues, Verbal cues, and Handouts Education comprehension: verbalized understanding, returned demonstration, verbal cues required, tactile cues required, and needs further education  HOME EXERCISE PROGRAM: Access Code: [email or web address] URL: [email or web address] Date: 08/01/2023 Prepared by: Garen Lah  Exercises - Supine Pelvic Tilt  - 1 x daily - 7 x weekly - 3 sets - 10 reps - Supine Single Knee to Chest Stretch  - 2 x daily - 7 x weekly - 1 sets - 5 reps - 10 hold - Supine Lower Trunk Rotation  - 2 x daily - 7 x weekly - 1 sets - 5 reps - 20 hold - Supine Hamstring Stretch with Strap  - 2 x daily - 7 x weekly - 1 sets - 3 reps - 20-12msec hold - Supine Bridge with Mini Swiss Ball Between Knees  - 1 x daily - 7 x weekly - 3 sets - 10 reps Added 08-07-23  - Supine March  - 1 x daily - 7 x weekly - 3 sets - 10 reps - Squat with Chair Touch  - 1 x daily - 7 x weekly - 3 sets -  10 reps - Prone Scapular Retraction Y  - 1 x daily - 7 x weekly - 3 sets - 10 reps - Prone Shoulder Horizontal Abduction  with Thumbs Up  - 1 x daily - 7 x weekly - 3 sets - 10 reps - Prone Shoulder Extension  - 1 x daily - 7 x weekly - 3 sets - 10 reps  Added 08-09-23 - Bear Plank from Quadruped  - 1 x daily - 7 x weekly - 1 sets - 5 reps - 10-30 sec hold Added 08-21-23 - Supine 90/90 Abdominal Bracing  - 1 x daily - 7 x weekly - 1 sets - 5 reps - 30 sec hold  ASSESSMENT:  CLINICAL IMPRESSION: Pt returns to clinic with no pain and reports that she is able to tolerate longer standing with strengthening.  She is enjoying learning how to use gym equipment and weights without injuring herself.   LTG # 3 and 5 achieved today. Alice Frye is still having to modify lifting weight due to her large breast tissue.  Pt will benefit from reinforcement of HEP including proper deadlift technique. Will continue to progress until all goals achieved   EVAL- Patient is a 33 y.o. female who was seen today for physical therapy evaluation and treatment for breast hypertrophy and low back pain and thoracic pain.  Pt has had pain since middle school but pain has increased to 10/10 in last 6 months.  Pt complains of difficulty sleeping , sitting and standing and walking for longer than 30 minutes causes 10/10 pain in low back and radiating into R buttock. Pt will benefit from skilled PT to address impairments.  OBJECTIVE IMPAIRMENTS: decreased activity tolerance, decreased mobility, difficulty walking, decreased ROM, decreased strength, postural dysfunction, obesity, and pain.   ACTIVITY LIMITATIONS: lifting, bending, sitting, standing, squatting, sleeping, stairs, and locomotion level  PARTICIPATION LIMITATIONS: meal prep, cleaning, laundry, and driving  PERSONAL FACTORS: L ankle fx > 5 years, obesity,  are also affecting patient's functional outcome.   REHAB POTENTIAL: Good  CLINICAL DECISION MAKING: Evolving/moderate complexity  EVALUATION COMPLEXITY: Moderate   GOALS: Goals reviewed with patient? Yes  SHORT TERM GOALS: Target  date: 08-25-23 corrected  Pt will be independent with initial HEP Baseline:  Goal status: MET  2.  Pt will demonstrate extension lumbar AROM in order to demonstrate improved tolerance to functional movement patterns.  Baseline: limited 50% Goal status: MET  3.  Demonstrate understanding of neutral posture and be more conscious of position and posture throughout the day.  Baseline:  Goal status: MET   LONG TERM GOALS: Target date: 09-15-23  Pt will be able to perform advanced HEP and be able to demonstrate proper lifting Baseline: no knowledge Goal status: ONGOING  2.  Pt will be able to report sleeping more than 4 hours of uninterrupted sleep for more restorative rest Baseline: can only sleep on sides for 3 hours max 08-16-23  At least sleeping 6 hours a night and doing well Goal status: MET  3.  Pt will be able to stand for 1 hour with minimal pain in order to perform job/household chore duties Baseline: Pain 10/10 08-16-23  Alice Frye is able to stand for 1 hour but then has pain and then sharp pain 08-22-23 Able to stand for 1 hour to complete house chores and work chores Goal status: MET  4.  .  Pt will demonstrate back/LE MMT of dead lift  25# in order to demonstrate improved strength for functional movements.  Baseline: See MMT chart 08-16-23  deadlifting 15 # with no issue Goal status:ONGOING  5.  Pt will tolerate sitting 1 hour in order to watch a show minimal to  pain Baseline: can only sit 30 min with 10/10 08-16-23  able to watch movie for 2 hours Goal status:MET  6.  will show a >/= 12 pt improvement in their ODI score (MCID is 12 pts) as a proxy for functional improvement Baseline: 42 08-16-23  4% Goal status: MET   PLAN:  PT FREQUENCY: 1-2x/week  PT DURATION: 6 weeks  PLANNED INTERVENTIONS: 97164- PT Re-evaluation, 97110-Therapeutic exercises, 97530- Therapeutic activity, 97112- Neuromuscular re-education, 97535- Self Care, 16109- Manual therapy, 97116- Gait  training, [phone]- Electrical stimulation (manual), Patient/Family education, Stair training, Taping, Dry Needling, Joint mobilization, Spinal mobilization, Cryotherapy, and Moist heat  PLAN FOR NEXT SESSION: TPDN,  HEP, Manual, posture education, sleep   Garen Lah, PT, [facility] Certified Exercise Expert for the Aging Adult  08/23/23 3:12 PM Phone: [phone] Fax: [phone]   For all possible CPT codes, reference the Planned Interventions line above.     Check all conditions that are expected to impact treatment: {Conditions expected to impact treatment:Morbid obesity   If treatment provided at initial evaluation, no treatment charged due to lack of authorization.

## 2023-08-23 ENCOUNTER — Ambulatory Visit: Payer: Medicaid Other | Admitting: Physical Therapy

## 2023-08-23 ENCOUNTER — Encounter: Payer: Self-pay | Admitting: Physical Therapy

## 2023-08-23 DIAGNOSIS — M5441 Lumbago with sciatica, right side: Secondary | ICD-10-CM

## 2023-08-23 DIAGNOSIS — R293 Abnormal posture: Secondary | ICD-10-CM

## 2023-08-23 DIAGNOSIS — M6281 Muscle weakness (generalized): Secondary | ICD-10-CM

## 2023-08-23 NOTE — Therapy (Signed)
 OUTPATIENT PHYSICAL THERAPY THORACOLUMBAR TREATMENT/DISCHARGE NOTE  PHYSICAL THERAPY DISCHARGE SUMMARY  Visits from Start of Care: 7  Current functional level related to goals / functional outcomes: As indicated below   Remaining deficits: None except excess breast tissue that prevents pt from participating in running or jumping activities for exercise   Education / Equipment: HEP  Community wellness  Patient agrees to discharge. Patient goals were met. Patient is being discharged due to meeting the stated rehab goals.    Patient Name: Alice Frye MRN: [id number] DOB:[birth year], 33 y.o., female Today's Date: 08/28/2023  END OF SESSION:  PT End of Session - 08/28/23 1557     Visit Number 7    Number of Visits 12    Date for PT Re-Evaluation 09/15/23    Authorization Type [employer]    PT Start Time 1554    PT Stop Time 1635    PT Time Calculation (min) 41 min    Activity Tolerance Patient tolerated treatment well    Behavior During Therapy [facility] for tasks assessed/performed                    History reviewed. No pertinent past medical history. Past Surgical History:  Procedure Laterality Date   CESAREAN SECTION     Patient Active Problem List   Diagnosis Date Noted   VBAC (vaginal birth after Cesarean) 05/25/2019   Nexplanon in place 05/25/2019   PROM (premature rupture of membranes) 05/23/2019   History of cesarean section 05/23/2019   History of VBAC 05/23/2019   Labor and delivery indication for care or intervention 06/21/2014   Ruptured, membranes, premature 06/21/2014   Injury of ankle, left 04/24/2013   Insertion of Nexplanon 08/29/2012   Vaginosis 08/15/2012   General counseling and advice on female contraception 01/04/2012   OBESITY 05/13/2008    PCP: Department, [facility]   REFERRING PROVIDER: Harlen Labs, NP   REFERRING DIAG: thoracic back pain,  (M54.6 Back pain), hypertrophy of breast  THERAPY  DIAG:  Bilateral low back pain with right-sided sciatica, unspecified chronicity  Muscle weakness (generalized)  Abnormal posture  Rationale for Evaluation and Treatment: Rehabilitation  ONSET DATE: Pt reports back pain since middle school but worse in the last 6 months  SUBJECTIVE:                                                                                                                                                                                                         SUBJECTIVE STATEMENT:  No pain today. I am  looking for a gym. I have looked at [employer].    EVAL - Pt reports back pain since middle school and I just never did anything about it.  My pain has gotten worse in the last 6 months. I am not able to exercise because of pain with my breasts and how affects my back.  I would like to get healthier and walking. I am trying to walk 30-45 minutes. I tried to do do weights at home but I ended up in the hospital. I did not know what to do.  I can't sit for more than 30 minutes.   Hand dominance: Right  PERTINENT HISTORY:  L ankle fx > 5 years, obesity,   PAIN:  Are you having pain? Yes: NPRS scale: without meds  10/10 and at rest now 0/10 sitting but doing chores for more than 30 minutes 10/10  Pain location: low back Pain description: sharp pain in low back and sometimes goes down to my Right buttocks Aggravating factors: sitting for more than 30 minutes. Have to sleep on my sides and constantly at night for only 3-4 hours, laundry,and bending over,  cleaning the tub going up and down steps Relieving factors: pain meds  PRECAUTIONS: None  RED FLAGS: None     WEIGHT BEARING RESTRICTIONS: No  FALLS:  Has patient fallen in last 6 months? Yes. Number of falls 1  going down steps and tripped and fell last week  LIVING ENVIRONMENT: Lives with: lives with their family Lives in: House/apartment Stairs:  no stairs in home but has pain on stairs Has following  equipment at home: None  OCCUPATION: Geologist, engineering  PLOF: Independent  PATIENT GOALS: Goals to learn exercises and help my pain and be able to exercise  NEXT MD VISIT: TBD  OBJECTIVE:  Note: Objective measures were completed at Evaluation unless otherwise noted.  DIAGNOSTIC FINDINGS:  None recent  PATIENT SURVEYS:  Modified Oswestry 42%  08-16-23  4%  COGNITION: Overall cognitive status: Within functional limits for tasks assessed  SENSATION: WFL  POSTURE: rounded shoulders, forward head, anterior pelvic tilt, and obesity  PALPATION: TTP over iliac crest and gluteals on right.  Bil low back tissue tension and TTP  R> L    LUMBAR SPECIAL TESTS:  Straight leg raise test: Negative and Slump test: Negative     LUMBAR ROM:   AROM eval 08-14-23 08-16-23 08-28-23  Flexion Fingertips to midshin  fingertips toes   Extension 50% with pain 75% 75%   Right lateral flexion WNL WNL WNL   Left lateral flexion WNL WNL WNL   Right rotation WNL WNL WNL   Left rotation WNL WNL WNL    (Blank rows = not tested)  LOWER EXTREMITY ROM:     Active  Right eval Left eval R/L 08-28-23  Hip flexion 90 supine 90 supine Standing 95 bil  Hip extension     Hip abduction     Hip adduction     Hip internal rotation 19 22 23/ 25  Hip external rotation     Knee flexion     Knee extension     Ankle dorsiflexion     Ankle plantarflexion     Ankle inversion     Ankle eversion      (Blank rows = not tested)  LOWER EXTREMITY MMT:    MMT Right eval Left eval R/L 08-23-23 R/L 08-28-23  Hip flexion 4+ 4+ 4/5 4+/5  Hip extension 4 4 4+/4+ 4+/4+  Hip  abduction 4 4 4+/4+ 4+/4+  Hip adduction      Hip internal rotation      Hip external rotation      Knee flexion 5 5 5/5 5/5  Knee extension 5 5 5/5 5/5  Ankle dorsiflexion      Ankle plantarflexion      Ankle inversion      Ankle eversion       (Blank rows = not tested)   (Blank rows = not tested) (Key: WFL = within functional  limits not formally assessed, * = concordant pain, s = stiffness/stretching sensation, NT = not tested)  Comments:     FUNCTIONAL TESTS:  5 times sit to stand: 13.23 sec  08-16-23  8.45 sec  [facility] Adult PT Treatment:                                                DATE: 08-28-23 Therapeutic Exercise: Elliptical  RPE 5/6  for 5 min All following with [employer] fitness equipment Leg press 55 # x 10, 75#  2 x 10,  85# 13 x , 105 # 15 x Lat pull bil 35 # x 10 , 45 # 2 x 8 Knee ext  35 # Bil x 10, bil 2 x 10 55#,  65 #  3 x 5 Knee flexion 35# bil  1 x 10 45# bil 1x 10, 55# bil 2 x 8 Reviewed HEP with questions  Therapeutic Activity: Lifting principles Deadlifting 30 # 3 x 6 Hip hinge in practical areas atwork and school Farmer's carry with 25 # KB in RT and 15 # in Left UE  [facility] Adult PT Treatment:                                                DATE: 08-23-23 Therapeutic Exercise: Treadmill  12 min and covering .50 miles at 2.5 and progressing ot 3.0 mph Gorilla row  2 x 515 # KB Bent over row  2 x 10 with 15 # KB [place] deadlift it with 25 # training bar   2 x 10 Clean and snatch with 25 # training bar 1 x 10 Clean and press with 25 # training bar 2 x 6 Counter push ups 4 x 3 push ups with cuing for execution  [facility] Adult PT Treatment:                                                DATE: 08-21-23 Therapeutic Exercise: Nustep 5 min level 3 UE/LE 15 # KB OH press on R 9x, L 10 BTB star pattern 1 x 10 15 # KB OH press on R 5 to fatigue, L BTB star pattern 2 x 10 Prone IYT with 4lb DB bil 3 x 10 Bear plank 15 sec, 9 sec , 10sec Bridge with ball  3 x 10 SL bridge on R 5 x and L 5 x with spasm Abdominal 90 90 hold for 10 sec x 6 Self Care:  Core progression and progressive overloading   [facility] Adult PT Treatment:  DATE: 08-16-23 goals Therapeutic Exercise: Nustep 5 min level 3 UE/LE Horizontal abduction omega  25 # 15 x, 25 # 12x, 35 # 10  x Lat pull omega  25 # 15 x, 25 # 12x, 35 # 10 x Seated bench press Omega  5x with 20 #, then 5 x with 25#, then 8 x On mat bent over row with 15 # KB 15 x on R and L Therapeutic Activity: STS 2 x 15  15 # KB Deadlift of 15 # Self Care:  Community wellness opportunities  [facility] Adult PT Treatment:                                                DATE: 08-14-23 Therapeutic Exercise: Elliptical level 2 6 min UE/LE Prone I, Y T   10 x each with 2lb DB ( 3 rounds) Bent over row with 15 # KB on mat with opposite hand and knee Pull downs with UE BTB in door way  2 x 10    Self Care: Posture Body mechanics  Lifting techniques and demo  [facility] Adult PT Treatment:                                                DATE: 08-08-23 Therapeutic Exercise: UBE  2.5 level    2.5 min forward and backward while taking subjective Standing scaption bil with 5lb DB 2 x 10 Standing scaption bil with 7 lb DB  4 x only with fatigue and start of upper thoracic spasm Standing bil shld abduction 3 x 10 5 lb DB Sidelying clamshell  2 x 10 on R and L Bridge with ball  2 x 10 Bear plank  10 sec hold x 5  Therapeutic Activity: Step ups on 6 inch step with 5 lb DB each hand  3 x 10 Lateral step up with 10 lb weight on R 3 x 10 Lateral step up with 10 lb weight on L 3 x 10 STS with 15 # KB 3 x 10  Self Care: Sleep Hygiene  [facility] Adult PT Treatment:                                                DATE: 08-07-23 Therapeutic Exercise: Prone I, Y and T with 1 lb DB  3 x 10 BTB bil shld extension  3 x 10 ER shoulder with BTB 3 x 10  PPT LTR SKTC Bridge with ball 3 x 10 Supine march Sidelying resisted clam Squat to chair with 10 # DB  3 x 10  TREATMENT DATE: 08-01-23  Eval and issue HEP  PATIENT EDUCATION:  Education details: POC Explanation of findings, issue HEP, posture initial  education Person educated: Patient Education method: Explanation, Demonstration, Tactile cues, Verbal cues, and Handouts Education comprehension: verbalized understanding, returned demonstration, verbal cues required, tactile cues required, and needs further education  HOME EXERCISE PROGRAM: Access Code: [email or web address] URL: [email or web address] Date: 08/01/2023 Prepared by: Garen Lah  Exercises - Supine Pelvic Tilt  - 1 x daily - 7 x weekly - 3 sets - 10 reps - Supine Single Knee to Chest Stretch  - 2 x daily - 7 x weekly - 1 sets - 5 reps - 10 hold - Supine Lower Trunk Rotation  - 2 x daily - 7 x weekly - 1 sets - 5 reps - 20 hold - Supine Hamstring Stretch with Strap  - 2 x daily - 7 x weekly - 1 sets - 3 reps - 20-12msec hold - Supine Bridge with Mini Swiss Ball Between Knees  - 1 x daily - 7 x weekly - 3 sets - 10 reps Added 08-07-23  - Supine March  - 1 x daily - 7 x weekly - 3 sets - 10 reps - Squat with Chair Touch  - 1 x daily - 7 x weekly - 3 sets - 10 reps - Prone Scapular Retraction Y  - 1 x daily - 7 x weekly - 3 sets - 10 reps - Prone Shoulder Horizontal Abduction with Thumbs Up  - 1 x daily - 7 x weekly - 3 sets - 10 reps - Prone Shoulder Extension  - 1 x daily - 7 x weekly - 3 sets - 10 reps  Added 08-09-23 - Bear Plank from Quadruped  - 1 x daily - 7 x weekly - 1 sets - 5 reps - 10-30 sec hold Added 08-21-23 - Supine 90/90 Abdominal Bracing  - 1 x daily - 7 x weekly - 1 sets - 5 reps - 30 sec hold Added 08-28-23  - Standing Hip Hinge with Dowel  - 1 x daily - 7 x weekly - 3 sets - 10 reps - Goblet Squat with Kettlebell  - 1 x daily - 7 x weekly - 3 sets - 10 reps - Kettlebell Deadlift  - 1 x daily - 7 x weekly - 3 sets - Farmer's Carry with Kettlebells  - 1 x daily - 7 x weekly - 2 sets - 8 reps  ASSESSMENT:  CLINICAL IMPRESSION: Pt returns to clinic with no pain and reports that she is able to tolerate longer standing with strengthening.  She is  pleased with her HEP and is motivated to joing a gym and continue exercising.  She is still impeded from jumping and running due to excess breast tissue but she has been very compliant with HEP and motivated to learn more about weight training as she works toward her goal of being more active for health.   Ms Libman is still having to modify lifting weight due to her large breast tissue. Pt HEP updated and pt has completed all goals. Ms Bowmer is ready for DC   EVAL- Patient is a 33 y.o. female who was seen today for physical therapy evaluation and treatment for breast hypertrophy and low back pain and thoracic pain.  Pt has had pain since middle school but pain has increased to 10/10 in last 6 months.  Pt complains of difficulty sleeping , sitting and standing and walking for longer than 30 minutes causes 10/10 pain in low back and radiating into  R buttock. Pt will benefit from skilled PT to address impairments.  OBJECTIVE IMPAIRMENTS: decreased activity tolerance, decreased mobility, difficulty walking, decreased ROM, decreased strength, postural dysfunction, obesity, and pain.   ACTIVITY LIMITATIONS: lifting, bending, sitting, standing, squatting, sleeping, stairs, and locomotion level  PARTICIPATION LIMITATIONS: meal prep, cleaning, laundry, and driving  PERSONAL FACTORS: L ankle fx > 5 years, obesity,  are also affecting patient's functional outcome.   REHAB POTENTIAL: Good  CLINICAL DECISION MAKING: Evolving/moderate complexity  EVALUATION COMPLEXITY: Moderate   GOALS: Goals reviewed with patient? Yes  SHORT TERM GOALS: Target date: 08-25-23 corrected  Pt will be independent with initial HEP Baseline:  Goal status: MET  2.  Pt will demonstrate extension lumbar AROM in order to demonstrate improved tolerance to functional movement patterns.  Baseline: limited 50% Goal status: MET  3.  Demonstrate understanding of neutral posture and be more conscious of position and posture  throughout the day.  Baseline:  Goal status: MET   LONG TERM GOALS: Target date: 09-15-23  Pt will be able to perform advanced HEP and be able to demonstrate proper lifting Baseline: no knowledge Goal status: MET  2.  Pt will be able to report sleeping more than 4 hours of uninterrupted sleep for more restorative rest Baseline: can only sleep on sides for 3 hours max 08-16-23  At least sleeping 6 hours a night and doing well Goal status: MET  3.  Pt will be able to stand for 1 hour with minimal pain in order to perform job/household chore duties Baseline: Pain 10/10 08-16-23  Eldoris is able to stand for 1 hour but then has pain and then sharp pain 08-22-23 Able to stand for 1 hour to complete house chores and work chores Goal status: MET  4.  .  Pt will demonstrate back/LE MMT of dead lift  25# in order to demonstrate improved strength for functional movements.  Baseline: See MMT chart 08-16-23  deadlifting 15 # with no issue 08-28-23 Goal status:MET  5.  Pt will tolerate sitting 1 hour in order to watch a show minimal to  pain Baseline: can only sit 30 min with 10/10 08-16-23  able to watch movie for 2 hours Goal status:MET  6.  will show a >/= 12 pt improvement in their ODI score (MCID is 12 pts) as a proxy for functional improvement Baseline: 42 08-16-23  4% Goal status: MET   PLAN:  PT FREQUENCY: 1-2x/week  PT DURATION: 6 weeks  PLANNED INTERVENTIONS: 97164- PT Re-evaluation, 97110-Therapeutic exercises, 97530- Therapeutic activity, 97112- Neuromuscular re-education, 97535- Self Care, 54098- Manual therapy, 97116- Gait training, [id number]- Electrical stimulation (manual), Patient/Family education, Stair training, Taping, Dry Needling, Joint mobilization, Spinal mobilization, Cryotherapy, and Moist heat  PLAN FOR NEXT SESSION: TPDN,  HEP, Manual, posture education, sleep   Garen Lah, PT, ATR[facility] Certified Exercise Expert for the Aging Adult  08/28/23 4:39 PM Phone:  [phone] Fax: [phone]   For all possible CPT codes, reference the Planned Interventions line above.     Check all conditions that are expected to impact treatment: {Conditions expected to impact treatment:Morbid obesity   If treatment provided at initial evaluation, no treatment charged due to lack of authorization.

## 2023-08-28 ENCOUNTER — Encounter: Payer: Self-pay | Admitting: Physical Therapy

## 2023-08-28 ENCOUNTER — Ambulatory Visit: Payer: Medicaid Other | Admitting: Physical Therapy

## 2023-08-28 DIAGNOSIS — M5441 Lumbago with sciatica, right side: Secondary | ICD-10-CM

## 2023-08-28 DIAGNOSIS — R293 Abnormal posture: Secondary | ICD-10-CM

## 2023-08-28 DIAGNOSIS — M6281 Muscle weakness (generalized): Secondary | ICD-10-CM

## 2023-08-30 ENCOUNTER — Ambulatory Visit: Payer: Medicaid Other | Admitting: Physical Therapy

## 2024-03-15 ENCOUNTER — Emergency Department (HOSPITAL_COMMUNITY)
Admission: EM | Admit: 2024-03-15 | Discharge: 2024-03-15 | Disposition: A | Discharge status: Home / Self Care | Attending: Emergency Medicine | Admitting: Emergency Medicine

## 2024-03-15 ENCOUNTER — Encounter (HOSPITAL_COMMUNITY): Payer: Self-pay | Admitting: Emergency Medicine

## 2024-03-15 DIAGNOSIS — R197 Diarrhea, unspecified: Secondary | ICD-10-CM | POA: Insufficient documentation

## 2024-03-15 DIAGNOSIS — R1084 Generalized abdominal pain: Secondary | ICD-10-CM | POA: Diagnosis not present

## 2024-03-15 DIAGNOSIS — R112 Nausea with vomiting, unspecified: Secondary | ICD-10-CM | POA: Insufficient documentation

## 2024-03-15 LAB — COMPREHENSIVE METABOLIC PANEL WITH GFR
ALT: 28 U/L (ref 0–44)
AST: 22 U/L (ref 15–41)
Albumin: 4.2 g/dL (ref 3.5–5.0)
Alkaline Phosphatase: 92 U/L (ref 38–126)
Anion gap: 14 (ref 5–15)
BUN: 14 mg/dL (ref 6–20)
CO2: 19 mmol/L — ABNORMAL LOW (ref 22–32)
Calcium: 9.8 mg/dL (ref 8.9–10.3)
Chloride: 104 mmol/L (ref 98–111)
Creatinine, Ser: 0.6 mg/dL (ref 0.44–1.00)
GFR, Estimated: >60 mL/min (ref >60)
Glucose, Bld: 133 mg/dL — ABNORMAL HIGH (ref 70–99)
Potassium: 3.6 mmol/L (ref 3.5–5.1)
Sodium: 137 mmol/L (ref 135–145)
Total Bilirubin: 0.3 mg/dL (ref 0.0–1.2)
Total Protein: 7.9 g/dL (ref 6.5–8.1)

## 2024-03-15 LAB — CBC
HCT: 38.4 % (ref 36.0–46.0)
Hemoglobin: 11.8 g/dL — ABNORMAL LOW (ref 12.0–15.0)
MCH: 26.8 pg (ref 26.0–34.0)
MCHC: 30.7 g/dL (ref 30.0–36.0)
MCV: 87.3 fL (ref 80.0–100.0)
Platelets: 356 K/uL (ref 150–400)
RBC: 4.4 MIL/uL (ref 3.87–5.11)
RDW: 13.5 % (ref 11.5–15.5)
WBC: 9.8 K/uL (ref 4.0–10.5)
nRBC: 0 % (ref 0.0–0.2)

## 2024-03-15 LAB — HCG, SERUM, QUALITATIVE: Preg, Serum: NEGATIVE

## 2024-03-15 LAB — LIPASE, BLOOD: Lipase: 15 U/L (ref 11–51)

## 2024-03-15 MED ORDER — FAMOTIDINE IN NACL 20-0.9 MG/50ML-% IV SOLN
20.0000 mg | INTRAVENOUS | Status: AC
  Administered 2024-03-15: 20 mg via INTRAVENOUS
  Filled 2024-03-15: qty 50

## 2024-03-15 MED ORDER — ONDANSETRON HCL 4 MG/2ML IJ SOLN
4.0000 mg | Freq: Once | INTRAMUSCULAR | Status: AC | PRN
  Administered 2024-03-15: 4 mg via INTRAVENOUS
  Filled 2024-03-15: qty 2

## 2024-03-15 MED ORDER — LACTATED RINGERS IV BOLUS
1000.0000 mL | Freq: Once | INTRAVENOUS | Status: AC
  Administered 2024-03-15: 1000 mL via INTRAVENOUS

## 2024-03-15 MED ORDER — PROMETHAZINE HCL 25 MG RE SUPP: 25.0000 mg | Freq: Four times a day (QID) | RECTAL | 0 refills | Status: DC | PRN

## 2024-03-15 MED ORDER — METOCLOPRAMIDE HCL 5 MG/ML IJ SOLN
10.0000 mg | INTRAMUSCULAR | Status: AC
  Administered 2024-03-15: 10 mg via INTRAVENOUS
  Filled 2024-03-15: qty 2

## 2024-03-15 MED ORDER — KETOROLAC TROMETHAMINE 15 MG/ML IJ SOLN
15.0000 mg | Freq: Once | INTRAMUSCULAR | Status: AC
  Administered 2024-03-15: 15 mg via INTRAVENOUS
  Filled 2024-03-15: qty 1

## 2024-03-15 NOTE — ED Notes (Signed)
 PO challenge started with ginger ale and saltines

## 2024-03-15 NOTE — Discharge Instructions (Addendum)
 Use of Zofran  for management of nausea.  Drink plenty of clear liquids to prevent dehydration.  If nausea and vomiting continued despite Zofran  use, you may try Phenergan  suppositories as prescribed.  Continue a bland diet until nausea and diarrhea improve; avoid fried foods, fatty and greasy foods, milk products until symptoms resolve. You may benefit from the use of over-the-counter probiotics.  Follow-up with a primary doctor.

## 2024-03-15 NOTE — ED Provider Notes (Signed)
 [place] EMERGENCY DEPARTMENT AT [facility] Provider Note   CSN: [id number] Arrival date & time: 03/15/24  9966     Patient presents with: Emesis and Abdominal Pain   Alice Frye is a 33 y.o. female.   33 year old female presents to the emergency department for evaluation of vomiting and diarrhea.  She began a few days ago with generalized abdominal cramping.  Nausea and vomiting began yesterday.  She has been unable to tolerate food or fluids despite Zofran .  Last took this antiemetic at 2300.  Emesis and diarrhea been nonbloody.  No fevers or sick contacts.  Further denies chest pain, SOB.  No prior abdominal surgeries.   The history is provided by the patient. No language interpreter was used.  Emesis Associated symptoms: abdominal pain   Abdominal Pain Associated symptoms: vomiting        Prior to Admission medications   Medication Sig Start Date End Date Taking? Authorizing Provider  promethazine  (PHENERGAN ) 25 MG suppository Place 1 suppository (25 mg total) rectally every 6 (six) hours as needed for nausea or vomiting. 03/15/24  Yes Keith Sor, PA-C  acetaminophen  (TYLENOL ) 325 MG tablet Take 2 tablets (650 mg total) by mouth every 6 (six) hours as needed for moderate pain. Patient not taking: Reported on 08/01/2023 08/09/21   Christopher Savannah, PA-C  benzonatate  (TESSALON ) 100 MG capsule Take 1-2 capsules (100-200 mg total) by mouth 3 (three) times daily as needed for cough. Patient not taking: Reported on 08/01/2023 08/09/21   Christopher Savannah, PA-C  cetirizine  (ZYRTEC  ALLERGY) 10 MG tablet Take 1 tablet (10 mg total) by mouth daily. Patient not taking: Reported on 08/01/2023 08/09/21   Christopher Savannah, PA-C  ibuprofen  (ADVIL ) 800 MG tablet Take 1 tablet (800 mg total) by mouth 3 (three) times daily. Patient not taking: Reported on 08/01/2023 04/01/21   Teresa Shelba SAUNDERS, NP  promethazine -dextromethorphan (PROMETHAZINE -DM) 6.25-15 MG/5ML syrup Take 5 mLs by mouth at bedtime  as needed for cough. Patient not taking: Reported on 08/01/2023 08/09/21   Christopher Savannah, PA-C  pseudoephedrine  (SUDAFED) 30 MG tablet Take 1 tablet (30 mg total) by mouth every 8 (eight) hours as needed for congestion. Patient not taking: Reported on 08/01/2023 08/09/21   Christopher Savannah, PA-C    Allergies: Patient has no known allergies.    Review of Systems  Gastrointestinal:  Positive for abdominal pain and vomiting.  Ten systems reviewed and are negative for acute change, except as noted in the HPI.    Updated Vital Signs BP 113/85   Pulse (!) 106   Temp 97.9 F (36.6 C) (Oral)   Resp 14   Ht 5' (1.524 m)   Wt 106.6 kg   SpO2 100%   BMI 45.90 kg/m   Physical Exam Vitals and nursing note reviewed.  Constitutional:      General: She is not in acute distress.    Appearance: She is well-developed. She is not diaphoretic.     Comments: Appears uncomfortable, nauseated  HENT:     Head: Normocephalic and atraumatic.  Eyes:     General: No scleral icterus.    Conjunctiva/sclera: Conjunctivae normal.  Cardiovascular:     Rate and Rhythm: Regular rhythm. Tachycardia present.     Pulses: Normal pulses.     Comments: Mild tachycardia Pulmonary:     Effort: Pulmonary effort is normal. No respiratory distress.     Breath sounds: No stridor. No wheezing.     Comments: Respirations even and unlabored Abdominal:  Palpations: Abdomen is soft.     Tenderness: There is no guarding.     Comments: No focal TTP. Abdomen soft, obese. No peritoneal signs.  Musculoskeletal:        General: Normal range of motion.     Cervical back: Normal range of motion.  Skin:    General: Skin is warm and dry.     Coloration: Skin is not pale.     Findings: No erythema or rash.  Neurological:     Mental Status: She is alert and oriented to person, place, and time.     Coordination: Coordination normal.  Psychiatric:        Behavior: Behavior normal.     (all labs ordered are listed, but only  abnormal results are displayed) Labs Reviewed  COMPREHENSIVE METABOLIC PANEL WITH GFR - Abnormal; Notable for the following components:      Result Value   CO2 19 (*)    Glucose, Bld 133 (*)    All other components within normal limits  CBC - Abnormal; Notable for the following components:   Hemoglobin 11.8 (*)    All other components within normal limits  LIPASE, BLOOD  HCG, SERUM, QUALITATIVE    EKG: None  Radiology: No results found.   Procedures   Medications Ordered in the ED  ondansetron  (ZOFRAN ) injection 4 mg (4 mg Intravenous Given 03/15/24 0130)  lactated ringers  bolus 1,000 mL (0 mLs Intravenous Stopped 03/15/24 0357)  famotidine (PEPCID) IVPB 20 mg premix (0 mg Intravenous Stopped 03/15/24 0230)  metoCLOPramide  (REGLAN ) injection 10 mg (10 mg Intravenous Given 03/15/24 0133)  ketorolac  (TORADOL ) 15 MG/ML injection 15 mg (15 mg Intravenous Given 03/15/24 0139)  lactated ringers  bolus 1,000 mL (1,000 mLs Intravenous New Bag/Given 03/15/24 0434)    Clinical Course as of 03/15/24 0545  Sat Mar 15, 2024  0212 Labs reviewed and are generally reassuring.  Patient has no leukocytosis.  In the absence of fever, tachypnea, elevated heart rate, no current concern for SIRS/sepsis.  She does have a bicarb of 19, suspected secondary to GI losses.  Normal anion gap.  No other electrolyte derangements.  Receiving IV fluids. [KH]  [id number] Patient reports she is feeling better.  Repeat abdominal exam stable.  Will trial oral fluid challenge. [KH]  9495 Patient tolerating PO fluids without issue. [KH]  [phone] Called lab to inquire about pregnancy test results.  Laboratory technician reports that it should result shortly. [KH]    Clinical Course User Index [KH] Keith Sor, PA-C                                 Medical Decision Making Amount and/or Complexity of Data Reviewed Labs: ordered.  Risk Prescription drug management.   This patient presents to the ED for concern of V/D, this  involves an extensive number of treatment options, and is a complaint that carries with it a high risk of complications and morbidity.  The differential diagnosis includes VGE vs pSBO/SBO vs ingestion vs food-borne illness   Co morbidities that complicate the patient evaluation  None known   Additional history obtained:  External records from outside source obtained and reviewed including negative flu and COVID testing at [facility] yesterday   Lab Tests:  I Ordered, and personally interpreted labs.  The pertinent results include:  Hgb 11.8, Glucose 133   Cardiac Monitoring:  The patient was maintained on a cardiac monitor.  I personally  viewed and interpreted the cardiac monitored which showed an underlying rhythm of: NSR   Medicines ordered and prescription drug management:  I ordered medication including Toradol  for pain and Pepcid and Reglan  for nausea/vomiting  Reevaluation of the patient after these medicines showed that the patient improved I have reviewed the patients home medicines and have made adjustments as needed   Test Considered:  CT abd/pelvis   Problem List / ED Course:  As above Patient with symptoms consistent with viral gastroenteritis.  Vitals are stable, no fever.  No signs of dehydration, tolerating PO fluids.  Lungs are clear.  No focal abdominal pain.  Doubt emergent abdominal pathology. Supportive therapy indicated with return if symptoms worsen. Encouraged return for new or concerning symptoms.   Reevaluation:  After the interventions noted above, I reevaluated the patient and found that they have :improved   Social Determinants of Health:  Good social support   Dispostion:  After consideration of the diagnostic results and the patients response to treatment, I feel that the patent would benefit from outpatient supportive care measures.  Encouraged follow-up with PCP for recheck. Return precautions discussed and provided. Patient discharged in  stable condition with no unaddressed concerns.      Final diagnoses:  Vomiting and diarrhea    ED Discharge Orders          Ordered    promethazine  (PHENERGAN ) 25 MG suppository  Every 6 hours PRN        03/15/24 0543               Keith Sor, PA-C 03/15/24 0549    Palumbo, April, MD 03/15/24 9440

## 2024-03-15 NOTE — ED Triage Notes (Addendum)
 Patient c/o abdominal pain and emesis x 1 day. Patient report unable to keep anything down. Patient report UC given her Zofran  p.o at 11pm last night without relief. Patient report emesis x 15 today. Patient denies SOB and Chest pain.

## 2024-04-14 ENCOUNTER — Encounter: Payer: Self-pay | Admitting: Radiology

## 2024-05-29 ENCOUNTER — Other Ambulatory Visit: Payer: Self-pay

## 2024-05-29 ENCOUNTER — Encounter (HOSPITAL_BASED_OUTPATIENT_CLINIC_OR_DEPARTMENT_OTHER): Payer: Self-pay | Admitting: Plastic Surgery

## 2024-05-29 NOTE — H&P (Signed)
 Subjective Patient ID: Alice Frye is a 33 y.o. female.     HPI   Returns for follow up discussion prior to planned breast reduction. Not sure current size. Reports several year history shoulder pain, back pain, rashes over back beneath bra and over shoulders. She has tried hot packs, PT course, specialty fitted bras, OTC pain medication, and hygiene measures for over 3 month trial without relief. Denies numbness hands.   Wt- highest 253. Used Wegovy with 23 lb weight loss. However this medication is no longer covered by her insurance and last dose 10.2025.   Patient underwent bilateral MMG and left breast US  11/2019 for concern palpable mass. These were both normal. Patient denies FH breast or ovarian ca.   Works for [employer], Education Officer, [employer]. Lives with spouse and kids ages 89, 39, and 52. States done having kids.    Review of Systems  All other systems reviewed and are negative.    Objective Physical Exam  Cardiovascular: Normal rate, regular rhythm and normal heart sounds.    Pulmonary/Chest Effort normal and breath sounds normal.     Lymph: no palpable axillary adenopathy   +shoulder grooving Breasts: no palpable masses, grade 3 ptosis bilateral SN to nipple R 43 L 42 cm BW R 25 L 24 cm Nipple to IMF R 20 L 19 cm   Assessment/Plan Macromastia Chronic neck and back pain Intertrigo Morbid obesity   Plan bilateral breast reduction with free nipple grafts.   Chronic shoulder and back pain, intertrigo back, in setting of macromastia that has failed conservative management over 3 month trial. The pain is not related to other diagnoses.There is a reasonable likelihood that the patient's symptoms are primarily due to macromastia. Breast reduction is likely to result in improvement of the chronic pain.   Reviewed reduction with anchor type scars, OP surgery, drains, post operative visits and limitations, recovery. Diminished sensation nipple and breast skin, risk of  nipple loss, wound healing problems, asymmetry, incidental carcinoma, changes with wt gain/loss, aging, unacceptable cosmetic appearance reviewed. Reviewed scar maturation over months, risks hyper or hypopigmented scar.  Reviewed that this is not a permanent operation.    Given her SN to nipple distance she is high risk NAC compromise. Recommend free nipple grafts. Counseled FNG will be permanently asensate, will not stimulate, will not be able to breast feed. Reviewed common to lose color, risk full failure of grafts.    Additional risks including but not limited to bleeding, hematoma, seroma, infection, wound healing problems, need for additional surgery, damage to adjacent structures, blood clots in legs or lung reviewed. Completed ASPS consent for breast reduction with free nipple grafts.  Drain teaching completed. Rx for tramadol given.     Anticipate 750 g resection from each breast   Earlis Ranks, MD [facility] Plastic & Reconstructive Surgery  Office/ physician access line after hours [phone]

## 2024-05-30 MED ORDER — CHLORHEXIDINE GLUCONATE CLOTH 2 % EX PADS: 6.0000 | MEDICATED_PAD | Freq: Once | CUTANEOUS | Status: DC

## 2024-05-30 NOTE — Progress Notes (Signed)

## 2024-06-10 ENCOUNTER — Encounter (HOSPITAL_BASED_OUTPATIENT_CLINIC_OR_DEPARTMENT_OTHER)
Admission: RE | Disposition: A | Discharge status: Home / Self Care | Payer: Self-pay | Source: Home / Self Care | Attending: Plastic Surgery

## 2024-06-10 ENCOUNTER — Encounter (HOSPITAL_BASED_OUTPATIENT_CLINIC_OR_DEPARTMENT_OTHER): Payer: Self-pay | Admitting: Anesthesiology

## 2024-06-10 ENCOUNTER — Ambulatory Visit (HOSPITAL_BASED_OUTPATIENT_CLINIC_OR_DEPARTMENT_OTHER)
Admission: RE | Admit: 2024-06-10 | Discharge: 2024-06-10 | Disposition: A | Discharge status: Home / Self Care | Attending: Plastic Surgery | Admitting: Plastic Surgery

## 2024-06-10 ENCOUNTER — Encounter (HOSPITAL_BASED_OUTPATIENT_CLINIC_OR_DEPARTMENT_OTHER): Payer: Self-pay | Admitting: Plastic Surgery

## 2024-06-10 ENCOUNTER — Ambulatory Visit (HOSPITAL_BASED_OUTPATIENT_CLINIC_OR_DEPARTMENT_OTHER): Payer: Self-pay | Admitting: Anesthesiology

## 2024-06-10 DIAGNOSIS — M542 Cervicalgia: Secondary | ICD-10-CM | POA: Insufficient documentation

## 2024-06-10 DIAGNOSIS — G8929 Other chronic pain: Secondary | ICD-10-CM | POA: Diagnosis not present

## 2024-06-10 DIAGNOSIS — L304 Erythema intertrigo: Secondary | ICD-10-CM | POA: Diagnosis not present

## 2024-06-10 DIAGNOSIS — M549 Dorsalgia, unspecified: Secondary | ICD-10-CM | POA: Insufficient documentation

## 2024-06-10 DIAGNOSIS — M5489 Other dorsalgia: Secondary | ICD-10-CM | POA: Diagnosis present

## 2024-06-10 DIAGNOSIS — N62 Hypertrophy of breast: Secondary | ICD-10-CM

## 2024-06-10 DIAGNOSIS — M25511 Pain in right shoulder: Secondary | ICD-10-CM | POA: Diagnosis present

## 2024-06-10 HISTORY — PX: BREAST REDUCTION SURGERY: SHX8

## 2024-06-10 LAB — POCT PREGNANCY, URINE: Preg Test, Ur: NEGATIVE

## 2024-06-10 SURGERY — MAMMOPLASTY, REDUCTION
Anesthesia: General | Site: Breast | Laterality: Bilateral

## 2024-06-10 MED ORDER — MIDAZOLAM HCL 2 MG/2ML IJ SOLN
INTRAMUSCULAR | Status: AC
  Filled 2024-06-10: qty 2

## 2024-06-10 MED ORDER — PROPOFOL 500 MG/50ML IV EMUL
INTRAVENOUS | Status: AC
  Filled 2024-06-10: qty 50

## 2024-06-10 MED ORDER — CELECOXIB 200 MG PO CAPS
ORAL_CAPSULE | ORAL | Status: AC
  Filled 2024-06-10: qty 1

## 2024-06-10 MED ORDER — PROPOFOL 10 MG/ML IV BOLUS
INTRAVENOUS | Status: AC
  Filled 2024-06-10: qty 20

## 2024-06-10 MED ORDER — PHENYLEPHRINE HCL (PRESSORS) 10 MG/ML IV SOLN
INTRAVENOUS | Status: DC | PRN
  Administered 2024-06-10 (×2): 80 ug via INTRAVENOUS

## 2024-06-10 MED ORDER — HYDROMORPHONE HCL 1 MG/ML IJ SOLN
INTRAMUSCULAR | Status: AC
  Filled 2024-06-10: qty 0.5

## 2024-06-10 MED ORDER — 0.9 % SODIUM CHLORIDE (POUR BTL) OPTIME
TOPICAL | Status: DC | PRN
  Administered 2024-06-10: 1000 mL

## 2024-06-10 MED ORDER — LIDOCAINE HCL (CARDIAC) PF 100 MG/5ML IV SOSY
PREFILLED_SYRINGE | INTRAVENOUS | Status: DC | PRN
  Administered 2024-06-10: 100 mg via INTRAVENOUS

## 2024-06-10 MED ORDER — CEFAZOLIN SODIUM-DEXTROSE 2-4 GM/100ML-% IV SOLN
INTRAVENOUS | Status: AC
  Filled 2024-06-10: qty 100

## 2024-06-10 MED ORDER — CELECOXIB 200 MG PO CAPS
200.0000 mg | ORAL_CAPSULE | ORAL | Status: AC
  Administered 2024-06-10: 200 mg via ORAL

## 2024-06-10 MED ORDER — GABAPENTIN 300 MG PO CAPS
ORAL_CAPSULE | ORAL | Status: AC
  Filled 2024-06-10: qty 1

## 2024-06-10 MED ORDER — SCOPOLAMINE 1 MG/3DAYS TD PT72
1.0000 | MEDICATED_PATCH | TRANSDERMAL | Status: DC
  Administered 2024-06-10: 1 mg via TRANSDERMAL

## 2024-06-10 MED ORDER — ACETAMINOPHEN 10 MG/ML IV SOLN: 1000.0000 mg | Freq: Once | INTRAVENOUS | Status: DC | PRN

## 2024-06-10 MED ORDER — FENTANYL CITRATE (PF) 100 MCG/2ML IJ SOLN
INTRAMUSCULAR | Status: DC | PRN
  Administered 2024-06-10 (×4): 50 ug via INTRAVENOUS

## 2024-06-10 MED ORDER — BUPIVACAINE HCL (PF) 0.5 % IJ SOLN
INTRAMUSCULAR | Status: DC | PRN
  Administered 2024-06-10: 30 mL

## 2024-06-10 MED ORDER — ONDANSETRON HCL 4 MG/2ML IJ SOLN
INTRAMUSCULAR | Status: AC
  Filled 2024-06-10: qty 2

## 2024-06-10 MED ORDER — PHENYLEPHRINE 80 MCG/ML (10ML) SYRINGE FOR IV PUSH (FOR BLOOD PRESSURE SUPPORT)
PREFILLED_SYRINGE | INTRAVENOUS | Status: AC
  Filled 2024-06-10: qty 10

## 2024-06-10 MED ORDER — DEXAMETHASONE SOD PHOSPHATE PF 10 MG/ML IJ SOLN
INTRAMUSCULAR | Status: DC | PRN
  Administered 2024-06-10: 10 mg via INTRAVENOUS

## 2024-06-10 MED ORDER — BUPIVACAINE HCL (PF) 0.5 % IJ SOLN
INTRAMUSCULAR | Status: AC
  Filled 2024-06-10: qty 60

## 2024-06-10 MED ORDER — BACITRACIN ZINC 500 UNIT/GM EX OINT
TOPICAL_OINTMENT | CUTANEOUS | Status: AC
  Filled 2024-06-10: qty 0.9

## 2024-06-10 MED ORDER — GABAPENTIN 300 MG PO CAPS
300.0000 mg | ORAL_CAPSULE | ORAL | Status: AC
  Administered 2024-06-10: 300 mg via ORAL

## 2024-06-10 MED ORDER — OXYCODONE HCL 5 MG PO TABS
5.0000 mg | ORAL_TABLET | Freq: Once | ORAL | Status: AC | PRN
  Administered 2024-06-10: 5 mg via ORAL

## 2024-06-10 MED ORDER — ONDANSETRON HCL 4 MG/2ML IJ SOLN: 4.0000 mg | Freq: Once | INTRAMUSCULAR | Status: DC | PRN

## 2024-06-10 MED ORDER — SUGAMMADEX SODIUM 200 MG/2ML IV SOLN
INTRAVENOUS | Status: DC | PRN
  Administered 2024-06-10: 200 mg via INTRAVENOUS

## 2024-06-10 MED ORDER — ACETAMINOPHEN 500 MG PO TABS
1000.0000 mg | ORAL_TABLET | ORAL | Status: AC
  Administered 2024-06-10: 1000 mg via ORAL

## 2024-06-10 MED ORDER — KETAMINE HCL 10 MG/ML IJ SOLN
INTRAMUSCULAR | Status: DC | PRN
  Administered 2024-06-10 (×2): 20 mg via INTRAVENOUS
  Administered 2024-06-10: 10 mg via INTRAVENOUS

## 2024-06-10 MED ORDER — MIDAZOLAM HCL 5 MG/5ML IJ SOLN
INTRAMUSCULAR | Status: DC | PRN
  Administered 2024-06-10: 2 mg via INTRAVENOUS

## 2024-06-10 MED ORDER — FENTANYL CITRATE (PF) 100 MCG/2ML IJ SOLN
INTRAMUSCULAR | Status: AC
  Filled 2024-06-10: qty 2

## 2024-06-10 MED ORDER — OXYCODONE HCL 5 MG/5ML PO SOLN: 5.0000 mg | Freq: Once | ORAL | Status: AC | PRN

## 2024-06-10 MED ORDER — AMISULPRIDE (ANTIEMETIC) 5 MG/2ML IV SOLN: 10.0000 mg | Freq: Once | INTRAVENOUS | Status: DC | PRN

## 2024-06-10 MED ORDER — ROCURONIUM BROMIDE 10 MG/ML (PF) SYRINGE
PREFILLED_SYRINGE | INTRAVENOUS | Status: AC
  Filled 2024-06-10: qty 10

## 2024-06-10 MED ORDER — OXYCODONE HCL 5 MG PO TABS
ORAL_TABLET | ORAL | Status: AC
  Filled 2024-06-10: qty 1

## 2024-06-10 MED ORDER — PROPOFOL 10 MG/ML IV BOLUS
INTRAVENOUS | Status: DC | PRN
  Administered 2024-06-10: 100 mg via INTRAVENOUS
  Administered 2024-06-10 (×4): 50 mg via INTRAVENOUS

## 2024-06-10 MED ORDER — HYDROMORPHONE HCL 1 MG/ML IJ SOLN
INTRAMUSCULAR | Status: DC | PRN
  Administered 2024-06-10: .5 mg via INTRAVENOUS

## 2024-06-10 MED ORDER — SCOPOLAMINE 1 MG/3DAYS TD PT72
MEDICATED_PATCH | TRANSDERMAL | Status: AC
  Filled 2024-06-10: qty 1

## 2024-06-10 MED ORDER — ONDANSETRON HCL 4 MG/2ML IJ SOLN
INTRAMUSCULAR | Status: DC | PRN
  Administered 2024-06-10: 4 mg via INTRAVENOUS

## 2024-06-10 MED ORDER — KETAMINE HCL 50 MG/5ML IJ SOSY
PREFILLED_SYRINGE | INTRAMUSCULAR | Status: AC
  Filled 2024-06-10: qty 5

## 2024-06-10 MED ORDER — CEFAZOLIN SODIUM-DEXTROSE 2-4 GM/100ML-% IV SOLN
2.0000 g | INTRAVENOUS | Status: AC
  Administered 2024-06-10: 2 g via INTRAVENOUS

## 2024-06-10 MED ORDER — HYDROMORPHONE HCL 1 MG/ML IJ SOLN
0.2500 mg | INTRAMUSCULAR | Status: DC | PRN
  Administered 2024-06-10 (×2): 0.5 mg via INTRAVENOUS

## 2024-06-10 MED ORDER — BACITRACIN 500 UNIT/GM EX OINT
TOPICAL_OINTMENT | CUTANEOUS | Status: DC | PRN
  Administered 2024-06-10: 2 via TOPICAL

## 2024-06-10 MED ORDER — PROPOFOL 500 MG/50ML IV EMUL
INTRAVENOUS | Status: DC | PRN
  Administered 2024-06-10: 50 ug/kg/min via INTRAVENOUS

## 2024-06-10 MED ORDER — ACETAMINOPHEN 500 MG PO TABS
ORAL_TABLET | ORAL | Status: AC
  Filled 2024-06-10: qty 2

## 2024-06-10 MED ORDER — LACTATED RINGERS IV SOLN: INTRAVENOUS | Status: DC

## 2024-06-10 MED ORDER — ROCURONIUM BROMIDE 100 MG/10ML IV SOLN
INTRAVENOUS | Status: DC | PRN
  Administered 2024-06-10: 20 mg via INTRAVENOUS
  Administered 2024-06-10: 70 mg via INTRAVENOUS

## 2024-06-10 MED ORDER — LIDOCAINE 2% (20 MG/ML) 5 ML SYRINGE
INTRAMUSCULAR | Status: AC
  Filled 2024-06-10: qty 5

## 2024-06-10 SURGICAL SUPPLY — 48 items
BINDER BREAST 3XL (GAUZE/BANDAGES/DRESSINGS) IMPLANT
BINDER BREAST LRG (GAUZE/BANDAGES/DRESSINGS) IMPLANT
BINDER BREAST MEDIUM (GAUZE/BANDAGES/DRESSINGS) IMPLANT
BINDER BREAST XLRG (GAUZE/BANDAGES/DRESSINGS) IMPLANT
BINDER BREAST XXLRG (GAUZE/BANDAGES/DRESSINGS) IMPLANT
BLADE SURG 10 STRL SS (BLADE) ×4 IMPLANT
BLADE SURG 15 STRL LF DISP TIS (BLADE) IMPLANT
BNDG GAUZE DERMACEA FLUFF 4 (GAUZE/BANDAGES/DRESSINGS) ×2 IMPLANT
BRUSH SCRUB EZ PLAIN DRY (MISCELLANEOUS) IMPLANT
CANISTER SUCT 1200ML W/VALVE (MISCELLANEOUS) ×1 IMPLANT
CHLORAPREP W/TINT 26 (MISCELLANEOUS) ×2 IMPLANT
COVER BACK TABLE 60X90IN (DRAPES) ×1 IMPLANT
COVER MAYO STAND STRL (DRAPES) ×1 IMPLANT
DERMABOND ADVANCED .7 DNX12 (GAUZE/BANDAGES/DRESSINGS) ×2 IMPLANT
DRAIN CHANNEL 15F RND FF W/TCR (WOUND CARE) IMPLANT
DRAPE TOP ARMCOVERS (MISCELLANEOUS) ×1 IMPLANT
DRAPE U-SHAPE 76X120 STRL (DRAPES) ×1 IMPLANT
DRAPE UTILITY XL STRL (DRAPES) ×1 IMPLANT
DRSG EMULSION OIL 3X3 NADH (GAUZE/BANDAGES/DRESSINGS) IMPLANT
ELECT COATED BLADE 2.86 ST (ELECTRODE) ×1 IMPLANT
ELECTRODE REM PT RTRN 9FT ADLT (ELECTROSURGICAL) ×1 IMPLANT
EVACUATOR SILICONE 100CC (DRAIN) IMPLANT
GAUZE PAD ABD 8X10 STRL (GAUZE/BANDAGES/DRESSINGS) ×2 IMPLANT
GLOVE BIO SURGEON STRL SZ 6 (GLOVE) ×2 IMPLANT
GOWN STRL REUS W/ TWL LRG LVL3 (GOWN DISPOSABLE) ×2 IMPLANT
MARKER SKIN DUAL TIP RULER LAB (MISCELLANEOUS) IMPLANT
NEEDLE HYPO 25X1 1.5 SAFETY (NEEDLE) ×1 IMPLANT
PACK BASIN DAY SURGERY FS (CUSTOM PROCEDURE TRAY) ×1 IMPLANT
PENCIL BUTTON HOLSTER BLD 10FT (ELECTRODE) IMPLANT
PENCIL SMOKE EVACUATOR (MISCELLANEOUS) ×1 IMPLANT
PIN SAFETY STERILE (MISCELLANEOUS) ×1 IMPLANT
SHEET MEDIUM DRAPE 40X70 STRL (DRAPES) ×1 IMPLANT
SLEEVE SCD COMPRESS KNEE MED (STOCKING) ×1 IMPLANT
SOLN 0.9% NACL POUR BTL 1000ML (IV SOLUTION) ×1 IMPLANT
SPONGE T-LAP 18X18 ~~LOC~~+RFID (SPONGE) ×3 IMPLANT
STAPLER SKIN PROX WIDE 3.9 (STAPLE) ×1 IMPLANT
SUT ETHILON 2 0 FS 18 (SUTURE) IMPLANT
SUT MNCRL AB 3-0 PS2 18 (SUTURE) IMPLANT
SUT MNCRL AB 4-0 PS2 18 (SUTURE) IMPLANT
SUT SILK 4 0 PS 2 (SUTURE) IMPLANT
SUT VIC AB 3-0 PS1 18XBRD (SUTURE) IMPLANT
SUT VICRYL RAPID 5 0 P 3 (SUTURE) IMPLANT
SYR BULB IRRIG 60ML STRL (SYRINGE) ×1 IMPLANT
SYR CONTROL 10ML LL (SYRINGE) ×1 IMPLANT
TOWEL GREEN STERILE FF (TOWEL DISPOSABLE) ×2 IMPLANT
TUBE CONNECTING 20X1/4 (TUBING) ×1 IMPLANT
UNDERPAD 30X36 HEAVY ABSORB (UNDERPADS AND DIAPERS) ×2 IMPLANT
YANKAUER SUCT BULB TIP NO VENT (SUCTIONS) ×1 IMPLANT

## 2024-06-10 NOTE — Op Note (Signed)
 Operative Note   DATE OF OPERATION: 12.30.2025  LOCATION: [facility]-outpatient  SURGICAL DIVISION: Plastic Surgery  PREOPERATIVE DIAGNOSES:  1. Macromastia 2. Chronic neck and back pain 3. Intertrigo  POSTOPERATIVE DIAGNOSES:  same  PROCEDURE:  Bilateral breast reduction with free nipple grafts  SURGEON: Earlis Ranks MD MBA  ASSISTANTBETHA VEAR Seats RNFA  ANESTHESIA:  General.   EBL: 250 ml  COMPLICATIONS: None immediate.   INDICATIONS FOR PROCEDURE:  The patient, Alice Frye, is a 33 y.o. female born on 12-26-1990, is here for treatment chronic neck and back pain, intertrigo, in setting of macromastia that has failed conservative measures.   FINDINGS: Right reduction 1616 g Left reduction 1457 g  DESCRIPTION OF PROCEDURE:  The patient was marked standing in the preoperative area to mark sternal notch, chest midline, anterior axillary lines, inframammary folds. The superior extent of vertical resection marked at level of inframammary fold on anterior surface breast by palpation. This was marked symmetric over bilateral breasts. Vertical limbs (11 cm) were marked by displacement of breasts along meridian. The patient was taken to the operating room. SCDs were placed and IV antibiotics were given. The patient's operative site was prepped and draped in a sterile fashion. A time out was performed and all information was confirmed to be correct.     I began on left breast. 45 mm diameter marked placed over left nipple areola complex and NAC graft harvested and placed in moist sponge. Over left breast, superior medial pedicle marked. Pedicle deepithlialized and developed to chest wall. Breast tissue resected over lower pole. Medial and lateral flaps developed. Additional superior and lateral breast tissue excised. Breast tailor tacked closed.    I then directed attention to right breast where 45 mm diameter marked placed over left nipple areola complex and NAC graft harvested  and placed in moist sponge. Superior medial pedicle of breast tissue designed. The pedicle was deepithelialized. Pedicle developed to chest wall. Breast tissue resected over lower pole. Medial and lateral flaps developed. Additional superior and lateral breast tissue excised. Breast tailor tacked closed. Patient assessed for symmetry. Breast cavities irrigated and hemostasis obtained. Local anesthetic infiltrated throughout each breast. 15 Fr JP placed in each breast and secured with 2-0 nylon. Closure completed bilateral with 3-0 vicryl to approximate dermis along inframammary fold and vertical limb. Skin closure completed with 4-0 monocryl subcuticular along vertical limb and 3-0 monocryl subcuticular along inframammary fold.   The location for placement left nipple areola graft were marked symmetric from clavicle. 42 mm diameter marker used to delineate area and these areas were de epithelialized bilateral. Grafts prepared and each side inset with 5-0 vicryl rapide. The graft was perforated. Antibiotic ointment, Adaptic guaze, and sterile sponge secured as bolster over each side with 4-0 silk suture. Tissue adhesive applied to remainder of incisions. Dry dressing and breast binder applied.  The patient was allowed to wake from anesthesia, extubated and taken to the recovery room in satisfactory condition.   SPECIMENS: right and left breast reduction  DRAINS: 15 Fr JP in right and left breast  Earlis Ranks, MD [facility] Plastic & Reconstructive Surgery  Office/ physician access line after hours [phone]

## 2024-06-10 NOTE — Discharge Instructions (Signed)
 " Post Anesthesia Home Care Instructions  Activity: Get plenty of rest for the remainder of the day. A responsible individual must stay with you for 24 hours following the procedure.  For the next 24 hours, DO NOT: -Drive a car -Advertising copywriter -Drink alcoholic beverages -Take any medication unless instructed by your physician -Make any legal decisions or sign important papers.  Meals: Start with liquid foods such as gelatin or soup. Progress to regular foods as tolerated. Avoid greasy, spicy, heavy foods. If nausea and/or vomiting occur, drink only clear liquids until the nausea and/or vomiting subsides. Call your physician if vomiting continues.  Special Instructions/Symptoms: Your throat may feel dry or sore from the anesthesia or the breathing tube placed in your throat during surgery. If this causes discomfort, gargle with warm salt water. The discomfort should disappear within 24 hours.  If you had a scopolamine  patch placed behind your ear for the management of post- operative nausea and/or vomiting:  1. The medication in the patch is effective for 72 hours, after which it should be removed.  Wrap patch in a tissue and discard in the trash. Wash hands thoroughly with soap and water. 2. You may remove the patch earlier than 72 hours if you experience unpleasant side effects which may include dry mouth, dizziness or visual disturbances. 3. Avoid touching the patch. Wash your hands with soap and water after contact with the patch.   About my Jackson-Pratt Bulb Drain  What is a Jackson-Pratt bulb? A Jackson-Pratt is a soft, round device used to collect drainage. It is connected to a long, thin drainage catheter, which is held in place by one or two small stiches near your surgical incision site. When the bulb is squeezed, it forms a vacuum, forcing the drainage to empty into the bulb.  Emptying the Jackson-Pratt bulb- To empty the bulb: 1. Release the plug on the top of the  bulb. 2. Pour the bulb's contents into a measuring container which your nurse will provide. 3. Record the time emptied and amount of drainage. Empty the drain(s) as often as your     doctor or nurse recommends.  Date                  Time                    Amount (Drain 1)                 Amount (Drain 2)  _____________________________________________________________________  _____________________________________________________________________  _____________________________________________________________________  _____________________________________________________________________  _____________________________________________________________________  _____________________________________________________________________  _____________________________________________________________________  _____________________________________________________________________  Squeezing the Jackson-Pratt Bulb- To squeeze the bulb: 1. Make sure the plug at the top of the bulb is open. 2. Squeeze the bulb tightly in your fist. You will hear air squeezing from the bulb. 3. Replace the plug while the bulb is squeezed. 4. Use a safety pin to attach the bulb to your clothing. This will keep the catheter from     pulling at the bulb insertion site.  When to call your doctor- Call your doctor if: Drain site becomes red, swollen or hot. You have a fever greater than 101 degrees F. There is oozing at the drain site. Drain falls out (apply a guaze bandage over the drain hole and secure it with tape). Drainage increases daily not related to activity patterns. (You will usually have more drainage when you are active than when you are resting.) Drainage has a bad odor.  No Tylenol  or  Ibuprofen  until after 12:30pm today. "

## 2024-06-10 NOTE — Transfer of Care (Signed)
 Immediate Anesthesia Transfer of Care Note  Patient: Alice Frye  Procedure(s) Performed: MAMMOPLASTY, REDUCTION (Bilateral: Breast)  Patient Location: PACU  Anesthesia Type:General  Level of Consciousness: drowsy, patient cooperative, and responds to stimulation  Airway & Oxygen Therapy: Patient Spontanous Breathing and Patient connected to face mask oxygen  Post-op Assessment: Report given to RN and Post -op Vital signs reviewed and stable  Post vital signs: Reviewed and stable  Last Vitals:  Vitals Value Taken Time  BP 125/67 06/10/24 11:07  Temp    Pulse 108 06/10/24 11:08  Resp 20 06/10/24 11:08  SpO2 99 % 06/10/24 11:08  Vitals shown include unfiled device data.  Last Pain:  Vitals:   06/10/24 0629  TempSrc: Temporal  PainSc: 0-No pain      Patients Stated Pain Goal: 3 (06/10/24 9370)  Complications: No notable events documented.

## 2024-06-10 NOTE — Anesthesia Preprocedure Evaluation (Addendum)
"                                    Anesthesia Evaluation  Patient identified by MRN, date of birth, ID band Patient awake    Reviewed: Allergy & Precautions, NPO status , Patient's Chart, lab work & pertinent test results  History of Anesthesia Complications Negative for: history of anesthetic complications  Airway Mallampati: I  TM Distance: >3 FB Neck ROM: Full    Dental  (+) Teeth Intact, Dental Advisory Given   Pulmonary neg pulmonary ROS   breath sounds clear to auscultation       Cardiovascular negative cardio ROS  Rhythm:Regular Rate:Normal     Neuro/Psych   Anxiety     negative neurological ROS     GI/Hepatic negative GI ROS,,,  Endo/Other  Previously on GLP1-agonist (last dose 03/2024)  Renal/GU negative Renal ROS     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   Abdominal   Peds  Hematology  (+) Blood dyscrasia, anemia   Anesthesia Other Findings   Reproductive/Obstetrics                              Anesthesia Physical Anesthesia Plan  ASA: 2  Anesthesia Plan: General   Post-op Pain Management:    Induction: Intravenous  PONV Risk Score and Plan: 4 or greater and Ondansetron , Treatment may vary due to age or medical condition, Dexamethasone , Propofol  infusion, Scopolamine  patch - Pre-op and Midazolam   Airway Management Planned: Oral ETT  Additional Equipment: None  Intra-op Plan:   Post-operative Plan: Extubation in OR  Informed Consent: I have reviewed the patients History and Physical, chart, labs and discussed the procedure including the risks, benefits and alternatives for the proposed anesthesia with the patient or authorized representative who has indicated his/her understanding and acceptance.     Dental advisory given  Plan Discussed with: CRNA  Anesthesia Plan Comments:          Anesthesia Quick Evaluation  "

## 2024-06-10 NOTE — Interval H&P Note (Signed)
 History and Physical Interval Note:  06/10/2024 6:57 AM  Alice Frye  has presented today for surgery, with the diagnosis of macromastia intertrigo shoulder pain back pain.  The various methods of treatment have been discussed with the patient and family. After consideration of risks, benefits and other options for treatment, the patient has consented to  Procedures with comments: MAMMOPLASTY, REDUCTION (Bilateral) - *BILATERAL BREAST REDUCTION WITH FREE NIPPLE GRAFTS* as a surgical intervention.  The patient's history has been reviewed, patient examined, no change in status, stable for surgery.  I have reviewed the patient's chart and labs.  Questions were answered to the patient's satisfaction.     [name]

## 2024-06-10 NOTE — Anesthesia Procedure Notes (Signed)
 Procedure Name: Intubation Date/Time: 06/10/2024 7:38 AM  Performed by: Frost Kayla MATSU, CRNAPre-anesthesia Checklist: Patient identified, Emergency Drugs available, Suction available and Patient being monitored Patient Re-evaluated:Patient Re-evaluated prior to induction Oxygen Delivery Method: Circle system utilized Preoxygenation: Pre-oxygenation with 100% oxygen Induction Type: IV induction Ventilation: Mask ventilation without difficulty Laryngoscope Size: Miller and 3 Grade View: Grade I Tube type: Oral Tube size: 7.0 mm Number of attempts: 1 Airway Equipment and Method: Stylet and Oral airway Placement Confirmation: ETT inserted through vocal cords under direct vision, positive ETCO2 and breath sounds checked- equal and bilateral Secured at: 20.5 cm Tube secured with: Tape Dental Injury: Teeth and Oropharynx as per pre-operative assessment

## 2024-06-11 ENCOUNTER — Encounter (HOSPITAL_BASED_OUTPATIENT_CLINIC_OR_DEPARTMENT_OTHER): Payer: Self-pay | Admitting: Plastic Surgery

## 2024-06-14 LAB — SURGICAL PATHOLOGY

## 2024-06-16 NOTE — Anesthesia Postprocedure Evaluation (Signed)
"   Anesthesia Post Note  Patient: Alice Frye  Procedure(s) Performed: MAMMOPLASTY, REDUCTION (Bilateral: Breast)     Patient location during evaluation: Phase II Anesthesia Type: General Level of consciousness: awake Pain management: pain level controlled Vital Signs Assessment: post-procedure vital signs reviewed and stable Respiratory status: spontaneous breathing Cardiovascular status: stable Postop Assessment: no apparent nausea or vomiting and adequate PO intake Anesthetic complications: no   No notable events documented.  Last Vitals:  Vitals:   06/10/24 1215 06/10/24 1228  BP: 118/61 122/69  Pulse: 86 91  Resp: 16 16  Temp:  36.5 C  SpO2: 93% 93%    Last Pain:  Vitals:   06/10/24 1232  TempSrc:   PainSc: 4                  Hilbert Briggs T Colhoun      "
